# Patient Record
Sex: Female | Born: 1960 | Race: Asian | Hispanic: No | Marital: Married | Smoking: Never smoker
Health system: Southern US, Community
[De-identification: ages and names within clinical notes are randomized; demographics above are authoritative.]

## PROBLEM LIST (undated history)

## (undated) DIAGNOSIS — C73 Malignant neoplasm of thyroid gland: Secondary | ICD-10-CM

## (undated) HISTORY — DX: Malignant neoplasm of thyroid gland: C73

## (undated) HISTORY — PX: COLONOSCOPY: SHX174

---

## 2010-10-26 ENCOUNTER — Ambulatory Visit: Payer: Self-pay | Admitting: Family Medicine

## 2011-06-14 ENCOUNTER — Other Ambulatory Visit: Payer: Self-pay | Admitting: Internal Medicine

## 2011-06-14 DIAGNOSIS — E049 Nontoxic goiter, unspecified: Secondary | ICD-10-CM

## 2011-06-23 ENCOUNTER — Other Ambulatory Visit: Payer: Self-pay

## 2011-11-07 ENCOUNTER — Other Ambulatory Visit: Payer: Self-pay

## 2013-10-25 ENCOUNTER — Other Ambulatory Visit: Payer: Self-pay | Admitting: Internal Medicine

## 2013-10-25 DIAGNOSIS — E042 Nontoxic multinodular goiter: Secondary | ICD-10-CM

## 2013-10-29 ENCOUNTER — Ambulatory Visit
Admission: RE | Admit: 2013-10-29 | Discharge: 2013-10-29 | Disposition: A | Discharge status: Home / Self Care | Payer: No Typology Code available for payment source | Source: Ambulatory Visit | Attending: Internal Medicine | Admitting: Internal Medicine

## 2013-10-29 DIAGNOSIS — E042 Nontoxic multinodular goiter: Secondary | ICD-10-CM

## 2014-10-31 ENCOUNTER — Other Ambulatory Visit: Payer: Self-pay | Admitting: Internal Medicine

## 2014-10-31 DIAGNOSIS — C73 Malignant neoplasm of thyroid gland: Secondary | ICD-10-CM

## 2014-10-31 DIAGNOSIS — E042 Nontoxic multinodular goiter: Secondary | ICD-10-CM

## 2014-11-07 ENCOUNTER — Ambulatory Visit
Admission: RE | Admit: 2014-11-07 | Discharge: 2014-11-07 | Disposition: A | Discharge status: Home / Self Care | Payer: Self-pay | Source: Ambulatory Visit | Attending: Internal Medicine | Admitting: Internal Medicine

## 2014-11-07 DIAGNOSIS — E042 Nontoxic multinodular goiter: Secondary | ICD-10-CM

## 2014-11-07 DIAGNOSIS — C73 Malignant neoplasm of thyroid gland: Secondary | ICD-10-CM

## 2015-02-16 IMAGING — US US SOFT TISSUE HEAD/NECK
1 series · 13 of 25 positions shown · non-contrast
Comparison: 12/19/2012 thyroid ultrasound from [place] provided on
compact disc.

CLINICAL DATA: 52-year-old female with thyroid nodules. Initial
encounter. Patient reports history of papillary thyroid carcinoma.
History of previous biopsy of a left isthmus nodule.

EXAM:
THYROID ULTRASOUND
TECHNIQUE: Ultrasound examination of the thyroid gland and adjacent soft
tissues was performed.

[Series 1: us soft tissue head/neck · 0.05mm/px · 13 of 55 slices shown]
[im 1/55]
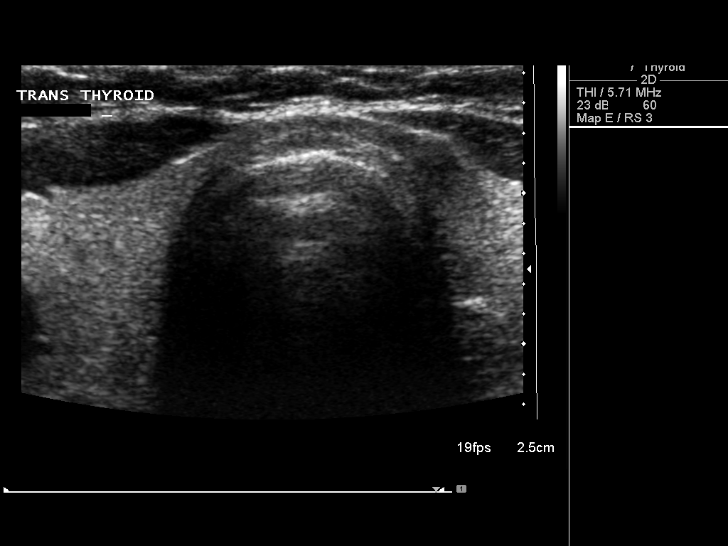
[im 5/55]
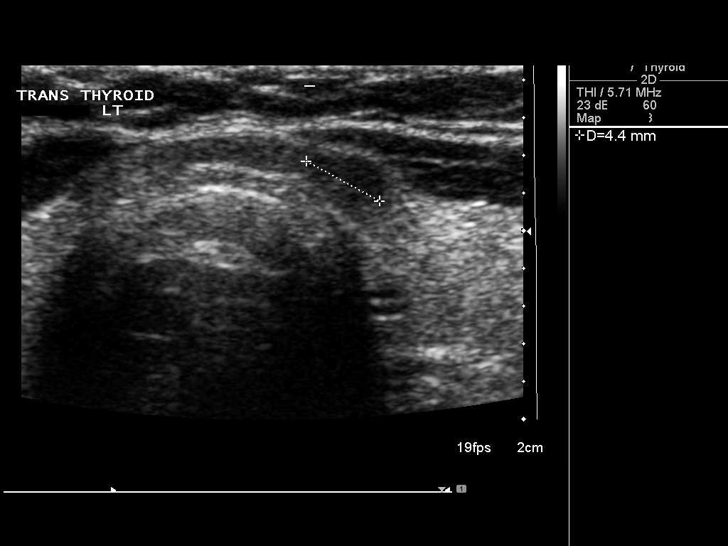
[im 10/55]
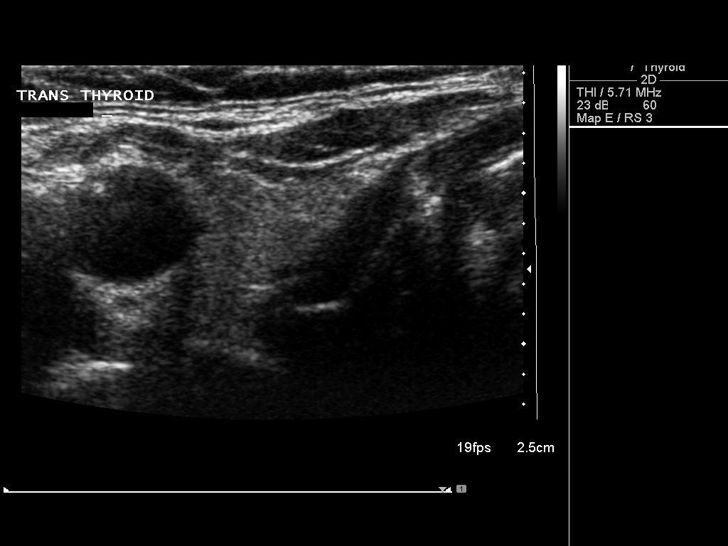
[im 14/55]
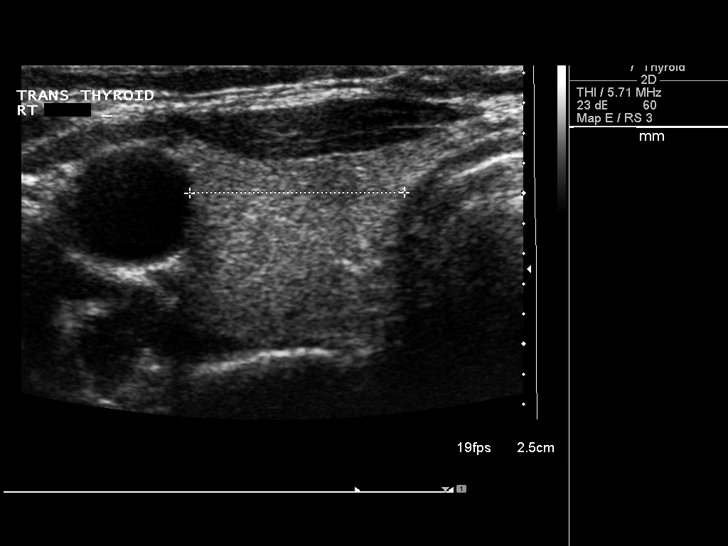
[im 19/55]
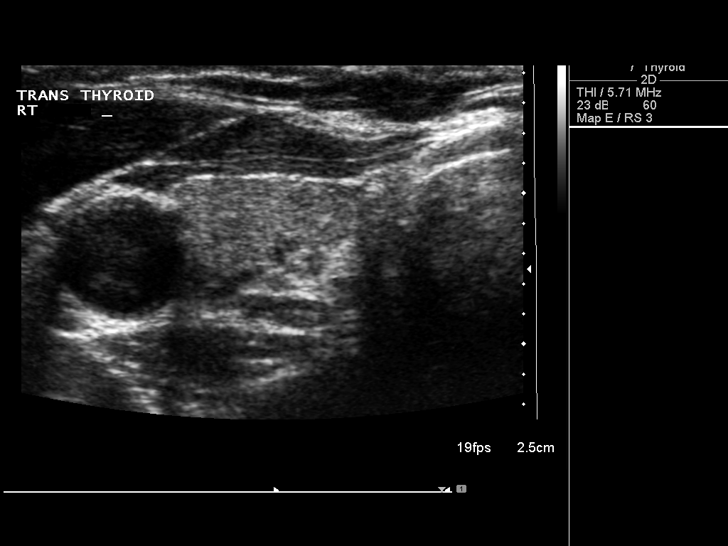
[im 23/55]
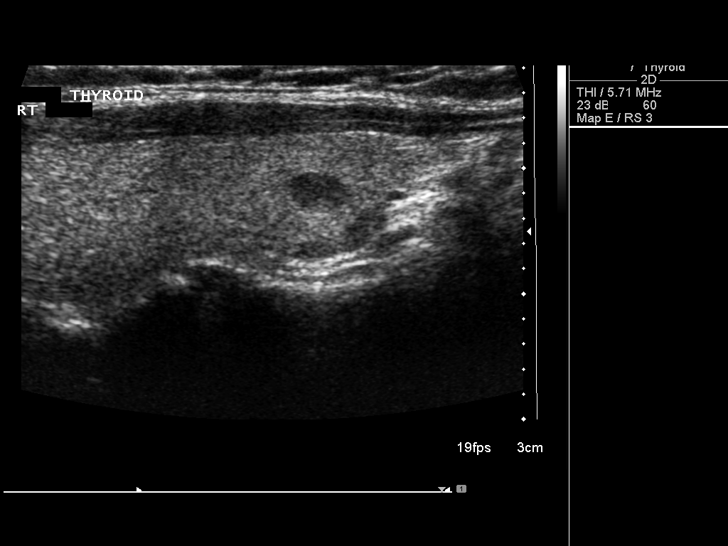
[im 28/55]
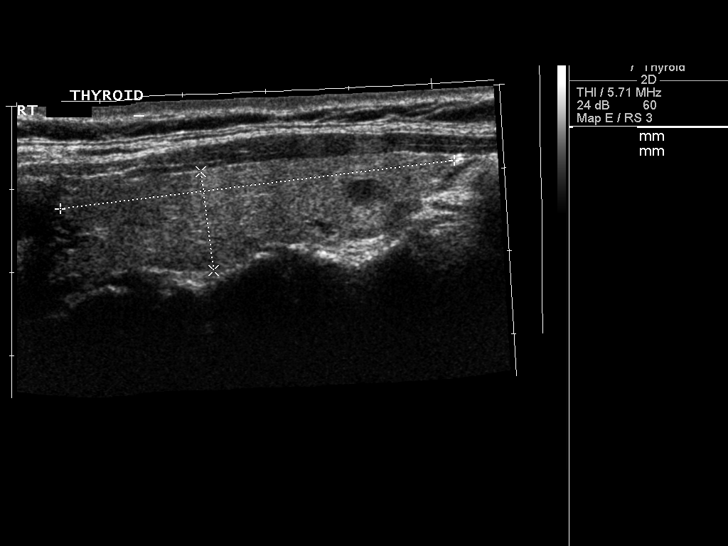
[im 32/55]
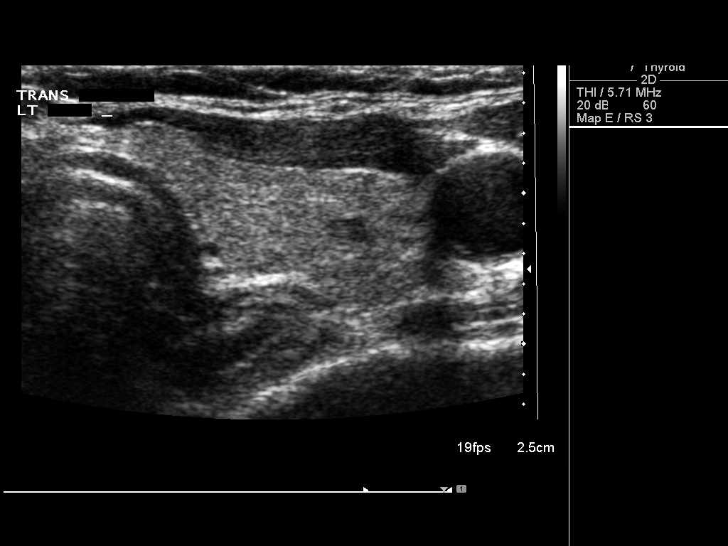
[im 37/55]
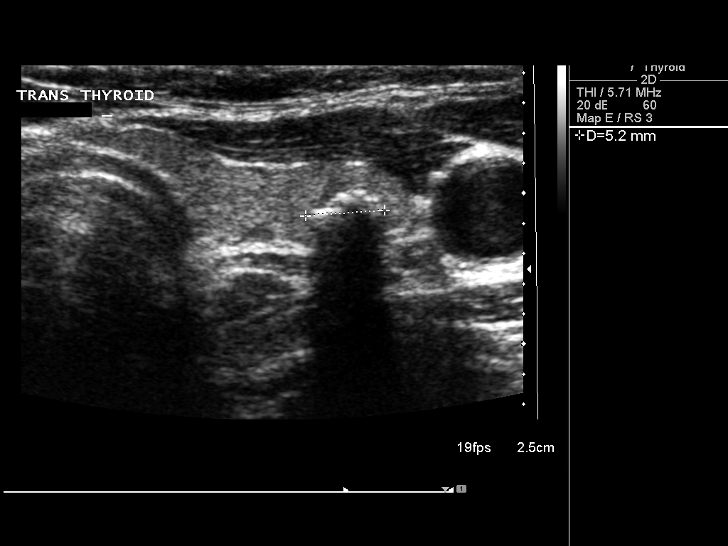
[im 41/55]
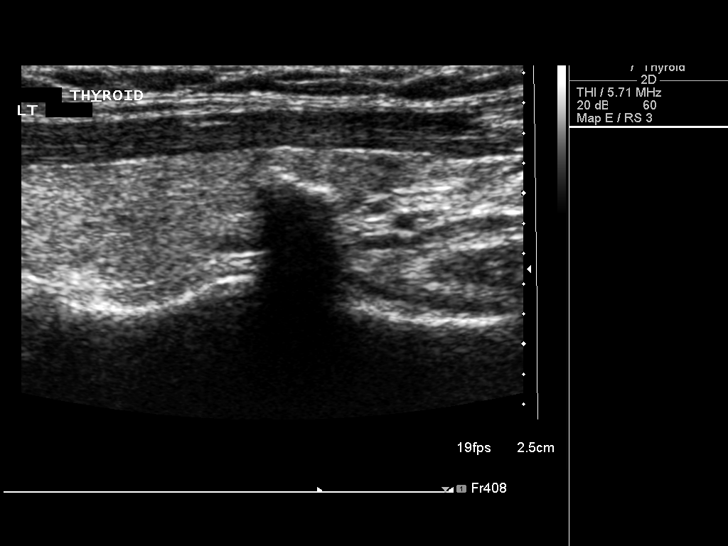
[im 46/55]
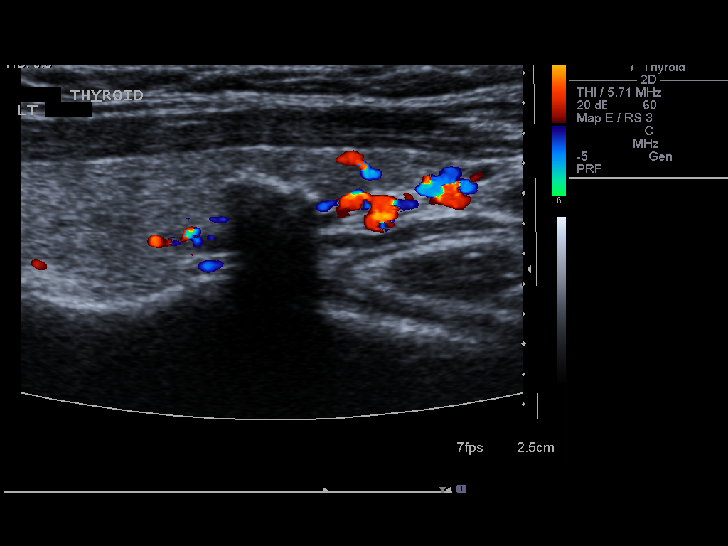
[im 50/55]
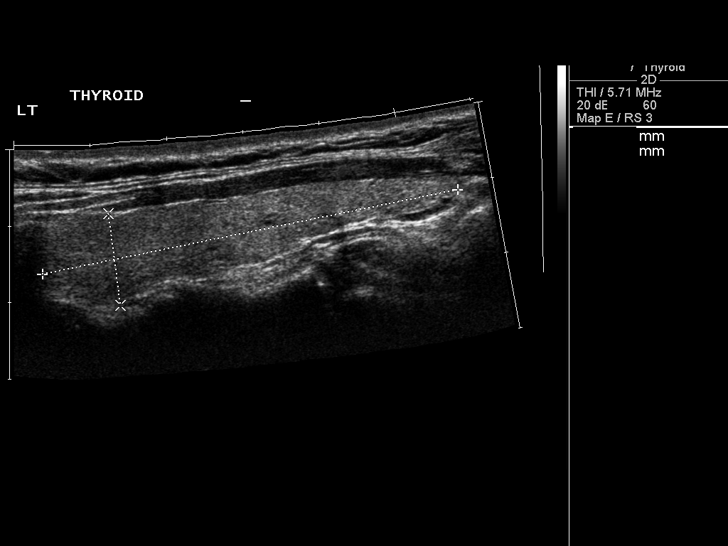
[im 55/55]
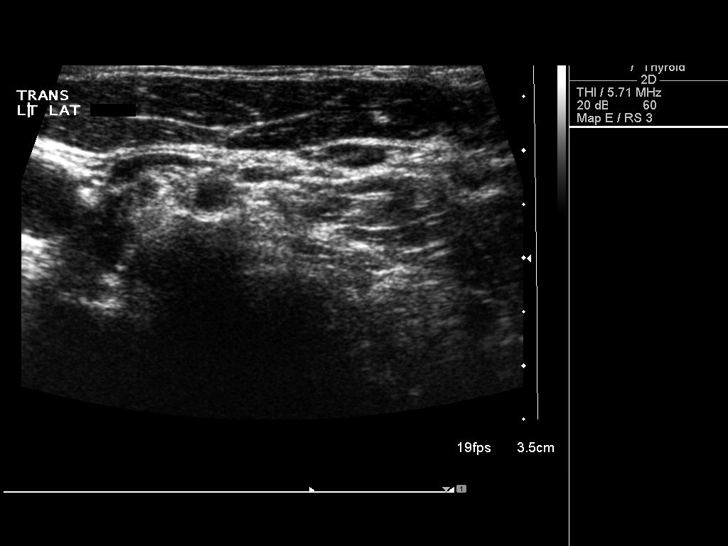

[13 of 25 positions shown; findings below may reference images not displayed]

FINDINGS: Right thyroid lobe

Measurements: 4.8 x 1.2 x 1.4 cm. 2 small hypoechoic nodules, the
larger is 5 x 3 x 5 mm (image 17) and stable. There is a small or 3
mm hypoechoic nodule in the upper pole not previously demonstrated.

Left thyroid lobe

Measurements: 5.5 x 1.2 x 1.5 cm. Chronic coarsely calcified nodule
measuring up to 7 mm diameter (image 38), stable. Tiny 3 mm
hypoechoic nodule in the left upper pole, not previously
demonstrated.

Isthmus

Thickness: 2 mm. Small 3 x 4 x 6 mm hypoechoic nodule at the left
lateral isthmus, stable.

Lymphadenopathy

None visualized.
IMPRESSION: Several subcentimeter bilateral thyroid nodules, the 3 largest
nodules (measuring up to 7 mm) are stable compared to the outside
12/19/2012 exam. There are 2 additional tiny 3 mm nodules which were
not demonstrated on that study.

By report, the 6 mm isthmus nodule was previously biopsied.

Findings do not meet current SRU consensus criteria for biopsy.
Follow-up by clinical exam is recommended. If patient has known risk
factors for thyroid carcinoma, consider follow-up ultrasound in 12
months. If patient is clinically hyperthyroid, consider nuclear
medicine thyroid uptake and scan.

Reference: Management of Thyroid Nodules Detected at US: Society of
Radiologists in Ultrasound Consensus Conference Statement. Radiology

## 2015-06-25 ENCOUNTER — Ambulatory Visit (INDEPENDENT_AMBULATORY_CARE_PROVIDER_SITE_OTHER): Payer: Self-pay | Admitting: Family Medicine

## 2015-06-25 ENCOUNTER — Encounter: Payer: Self-pay | Admitting: Family Medicine

## 2015-06-25 VITALS — BP 110/80 | HR 72 | Ht 63.0 in | Wt 136.0 lb

## 2015-06-25 DIAGNOSIS — R1084 Generalized abdominal pain: Secondary | ICD-10-CM

## 2015-06-25 DIAGNOSIS — R197 Diarrhea, unspecified: Secondary | ICD-10-CM

## 2015-06-25 LAB — POCT URINALYSIS DIPSTICK
BILIRUBIN UA: NEGATIVE
Blood, UA: NEGATIVE
GLUCOSE UA: NEGATIVE
KETONES UA: NEGATIVE
LEUKOCYTES UA: NEGATIVE
NITRITE UA: NEGATIVE
Protein, UA: NEGATIVE
Spec Grav, UA: 1.02
Urobilinogen, UA: NEGATIVE
pH, UA: 7

## 2015-06-25 NOTE — Progress Notes (Signed)
Name: Pamela Riddle   MRN: [id number]    DOB: [birth year]   Date:06/25/2015       Progress Note  Subjective  Chief Complaint  Chief Complaint  Patient presents with  . Establish Care  . Abdominal Pain    having symptoms of loose stools, more frequent, RLQ pain off and on since colonoscopy 2 years ago-  has to go to [place] right after eating regardless of what she eats    Abdominal Pain This is a chronic problem. The current episode started more than 1 year ago. The onset quality is gradual. The problem occurs daily. The problem has been unchanged. The pain is located in the suprapubic region. The pain is moderate. The quality of the pain is colicky. Associated symptoms include hematochezia and hematuria. Pertinent negatives include no constipation, diarrhea, dysuria, fever, frequency, headaches, melena, myalgias, nausea or weight loss. Associated symptoms comments: malaise. She has tried nothing (heat) for the symptoms.    No problem-specific assessment & plan notes found for this encounter.   Past Medical History  Diagnosis Date  . Thyroid cancer [facility])     Past Surgical History  Procedure Laterality Date  . Cesarean section      x 2  . Colonoscopy      in [place]- colonoscopy    No family history on file.  Social History   Social History  . Marital Status: Married    Spouse Name: N/A  . Number of Children: N/A  . Years of Education: N/A   Occupational History  . Not on file.   Social History Main Topics  . Smoking status: Never Smoker   . Smokeless tobacco: Not on file  . Alcohol Use: No  . Drug Use: No  . Sexual Activity: Not on file   Other Topics Concern  . Not on file   Social History Narrative  . No narrative on file    No Known Allergies   Review of Systems  Constitutional: Negative for fever, chills, weight loss and malaise/fatigue.  HENT: Negative for ear discharge, ear pain and sore throat.   Eyes: Negative for blurred vision.  Respiratory:  Negative for cough, sputum production, shortness of breath and wheezing.   Cardiovascular: Negative for chest pain, palpitations and leg swelling.  Gastrointestinal: Positive for abdominal pain, blood in stool and hematochezia. Negative for heartburn, nausea, diarrhea, constipation and melena.  Genitourinary: Positive for hematuria. Negative for dysuria, urgency and frequency.  Musculoskeletal: Negative for myalgias, back pain, joint pain and neck pain.  Skin: Negative for rash.  Neurological: Negative for dizziness, tingling, sensory change, focal weakness and headaches.  Endo/Heme/Allergies: Negative for environmental allergies and polydipsia. Does not bruise/bleed easily.  Psychiatric/Behavioral: Negative for depression and suicidal ideas. The patient is not nervous/anxious and does not have insomnia.      Objective  Filed Vitals:   06/25/15 1451  BP: 110/80  Pulse: 72  Height: 5\' 3"  (1.6 m)  Weight: 136 lb (61.689 kg)    Physical Exam  Constitutional: She is well-developed, well-nourished, and in no distress. No distress.  HENT:  Head: Normocephalic and atraumatic.  Right Ear: External ear normal.  Left Ear: External ear normal.  Nose: Nose normal.  Mouth/Throat: Oropharynx is clear and moist.  Eyes: Conjunctivae and EOM are normal. Pupils are equal, round, and reactive to light. Right eye exhibits no discharge. Left eye exhibits no discharge.  Neck: Normal range of motion. Neck supple. No JVD present. No thyromegaly present.  Cardiovascular: Normal rate,  regular rhythm, normal heart sounds and intact distal pulses.  Exam reveals no gallop and no friction rub.   No murmur heard. Pulmonary/Chest: Effort normal and breath sounds normal.  Abdominal: Soft. Bowel sounds are normal. She exhibits no mass. There is no tenderness. There is no guarding.  Musculoskeletal: Normal range of motion. She exhibits no edema.  Lymphadenopathy:    She has no cervical adenopathy.  Neurological:  She is alert. She has normal reflexes.  Skin: Skin is warm and dry. She is not diaphoretic.  Psychiatric: Mood and affect normal.      Assessment & Plan  Problem List Items Addressed This Visit    None    Visit Diagnoses    Generalized abdominal pain    -  Primary    hemoccult x 3/     Relevant Orders    POCT Urinalysis Dipstick (Completed)    Diarrhea, unspecified type             Dr. Mac[facility]  06/25/2015

## 2015-07-07 ENCOUNTER — Other Ambulatory Visit (INDEPENDENT_AMBULATORY_CARE_PROVIDER_SITE_OTHER): Payer: Self-pay

## 2015-07-07 DIAGNOSIS — R1084 Generalized abdominal pain: Secondary | ICD-10-CM

## 2015-07-07 LAB — HEMOCCULT GUIAC POC 1CARD (OFFICE)
FECAL OCCULT BLD: NEGATIVE
FECAL OCCULT BLD: NEGATIVE
FECAL OCCULT BLD: NEGATIVE

## 2015-07-21 ENCOUNTER — Ambulatory Visit (INDEPENDENT_AMBULATORY_CARE_PROVIDER_SITE_OTHER): Payer: Self-pay | Admitting: Family Medicine

## 2015-07-21 ENCOUNTER — Encounter: Payer: Self-pay | Admitting: Family Medicine

## 2015-07-21 VITALS — BP 120/80 | HR 76 | Ht 63.0 in | Wt 136.0 lb

## 2015-07-21 DIAGNOSIS — K921 Melena: Secondary | ICD-10-CM

## 2015-07-21 NOTE — Progress Notes (Signed)
Name: Pamela Riddle   MRN: [id number]    DOB: 1961/05/14   Date:07/21/2015       Progress Note  Subjective  Chief Complaint  Chief Complaint  Patient presents with  . Annual Exam    no pap and not time for mammo- had one at the beginning of this year    HPI Comments: Patient for physical exam with no subjective/objective concerns.   No problem-specific assessment & plan notes found for this encounter.   Past Medical History  Diagnosis Date  . Thyroid cancer [facility])     Past Surgical History  Procedure Laterality Date  . Cesarean section      x 2  . Colonoscopy      in [place]- colonoscopy    No family history on file.  Social History   Social History  . Marital Status: Married    Spouse Name: N/A  . Number of Children: N/A  . Years of Education: N/A   Occupational History  . Not on file.   Social History Main Topics  . Smoking status: Never Smoker   . Smokeless tobacco: Not on file  . Alcohol Use: No  . Drug Use: No  . Sexual Activity: Not on file   Other Topics Concern  . Not on file   Social History Narrative    No Known Allergies   Review of Systems  Constitutional: Negative for fever, chills, weight loss and malaise/fatigue.  HENT: Negative for ear discharge, ear pain and sore throat.   Eyes: Negative for blurred vision.  Respiratory: Negative for cough, sputum production, shortness of breath and wheezing.   Cardiovascular: Negative for chest pain, palpitations and leg swelling.  Gastrointestinal: Negative for heartburn, nausea, abdominal pain, diarrhea, constipation, blood in stool and melena.  Genitourinary: Negative for dysuria, urgency, frequency and hematuria.  Musculoskeletal: Negative for myalgias, back pain, joint pain and neck pain.  Skin: Negative for rash.  Neurological: Negative for dizziness, tingling, sensory change, focal weakness and headaches.  Endo/Heme/Allergies: Negative for environmental allergies and polydipsia. Does not  bruise/bleed easily.  Psychiatric/Behavioral: Negative for depression and suicidal ideas. The patient is not nervous/anxious and does not have insomnia.      Objective  Filed Vitals:   07/21/15 1432  BP: 120/80  Pulse: 76  Height: 5\' 3"  (1.6 m)  Weight: 136 lb (61.689 kg)    Physical Exam  Constitutional: She is well-developed, well-nourished, and in no distress. No distress.  HENT:  Head: Normocephalic and atraumatic.  Right Ear: External ear normal.  Left Ear: External ear normal.  Nose: Nose normal.  Mouth/Throat: Oropharynx is clear and moist.  Eyes: Conjunctivae and EOM are normal. Pupils are equal, round, and reactive to light. Right eye exhibits no discharge. Left eye exhibits no discharge.  Neck: Normal range of motion. Neck supple. No JVD present. No thyromegaly present.  Cardiovascular: Normal rate, regular rhythm, normal heart sounds and intact distal pulses.  Exam reveals no gallop and no friction rub.   No murmur heard. Pulmonary/Chest: Effort normal and breath sounds normal.  Abdominal: Soft. Bowel sounds are normal. She exhibits no mass. There is no tenderness. There is no guarding.  Musculoskeletal: Normal range of motion. She exhibits no edema.  Lymphadenopathy:    She has no cervical adenopathy.  Neurological: She is alert. She has normal reflexes.  Skin: Skin is warm and dry. She is not diaphoretic.  Psychiatric: Mood and affect normal.  Nursing note and vitals reviewed.     Assessment &  Plan  Problem List Items Addressed This Visit    None    Visit Diagnoses    Hematochezia    -  Primary    daughter to find out results of [place] evaluation.    Relevant Orders    CBC         Dr. Otilio Miu [facility]  07/21/2015

## 2015-07-21 NOTE — Patient Instructions (Signed)

## 2015-07-22 LAB — CBC
HEMATOCRIT: 37.4 % (ref 34.0–46.6)
HEMOGLOBIN: 12.8 g/dL (ref 11.1–15.9)
MCH: 29.3 pg (ref 26.6–33.0)
MCHC: 34.2 g/dL (ref 31.5–35.7)
MCV: 86 fL (ref 79–97)
Platelets: 333 10*3/uL (ref 150–379)
RBC: 4.37 x10E6/uL (ref 3.77–5.28)
RDW: 13.6 % (ref 12.3–15.4)
WBC: 7.1 10*3/uL (ref 3.4–10.8)

## 2015-11-19 ENCOUNTER — Other Ambulatory Visit: Payer: Self-pay | Admitting: Internal Medicine

## 2015-11-19 DIAGNOSIS — C73 Malignant neoplasm of thyroid gland: Secondary | ICD-10-CM

## 2015-11-24 ENCOUNTER — Ambulatory Visit
Admission: RE | Admit: 2015-11-24 | Discharge: 2015-11-24 | Disposition: A | Discharge status: Home / Self Care | Payer: No Typology Code available for payment source | Source: Ambulatory Visit | Attending: Internal Medicine | Admitting: Internal Medicine

## 2015-11-24 DIAGNOSIS — C73 Malignant neoplasm of thyroid gland: Secondary | ICD-10-CM

## 2017-03-13 IMAGING — US US SOFT TISSUE HEAD/NECK
1 series · 13 of 25 positions shown · non-contrast
Comparison: 11/07/2014, 10/29/2013, 10/26/2010.

CLINICAL DATA: 55-year-old female with a history of papillary
carcinoma thyroid

EXAM:
THYROID ULTRASOUND
TECHNIQUE: Ultrasound examination of the thyroid gland and adjacent soft
tissues was performed.

[Series 1: us soft tissue head/neck · 0.07mm/px · 13 of 52 slices shown]
[im 1/52]
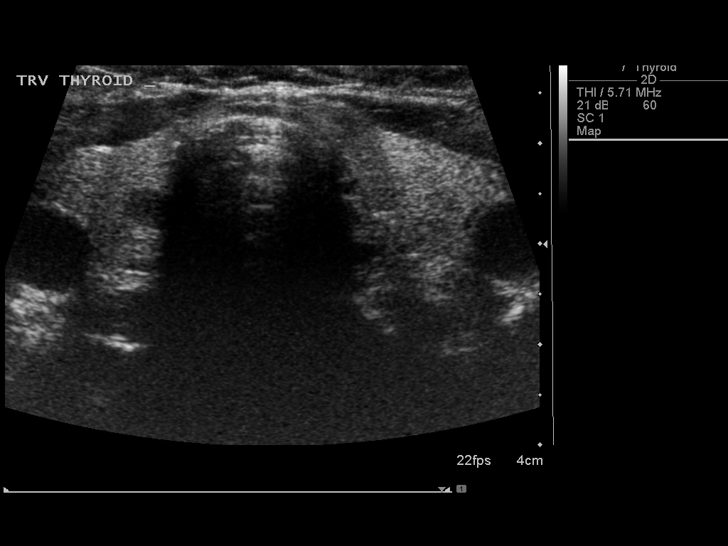
[im 5/52]
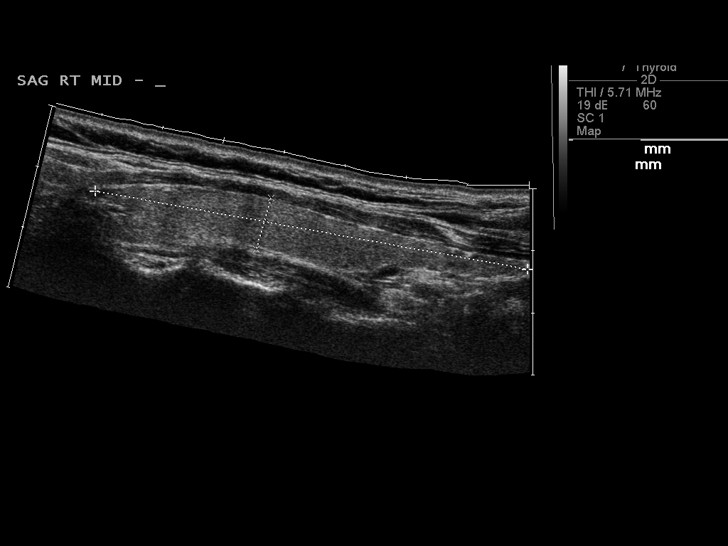
[im 9/52]
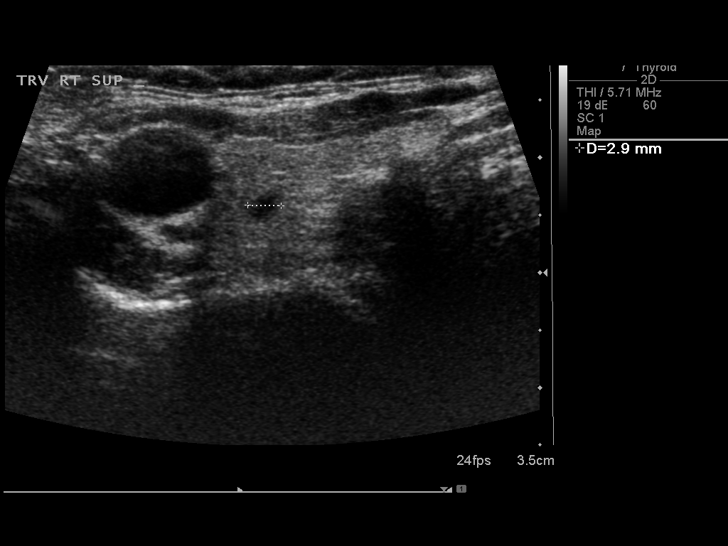
[im 13/52]
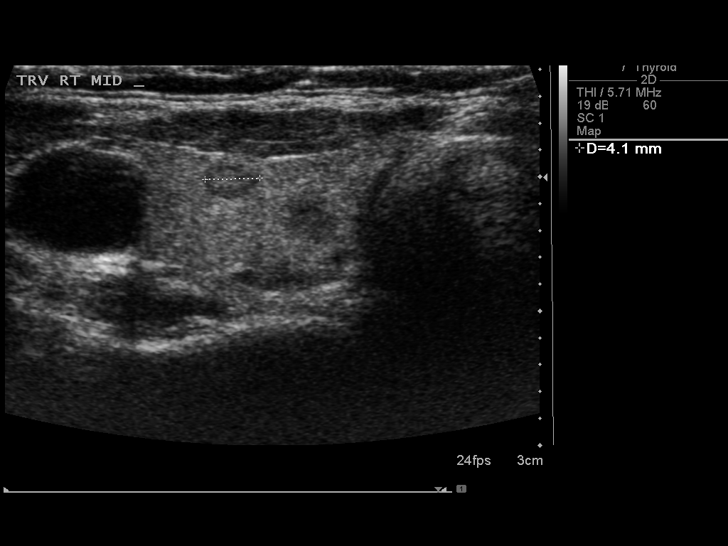
[im 18/52]
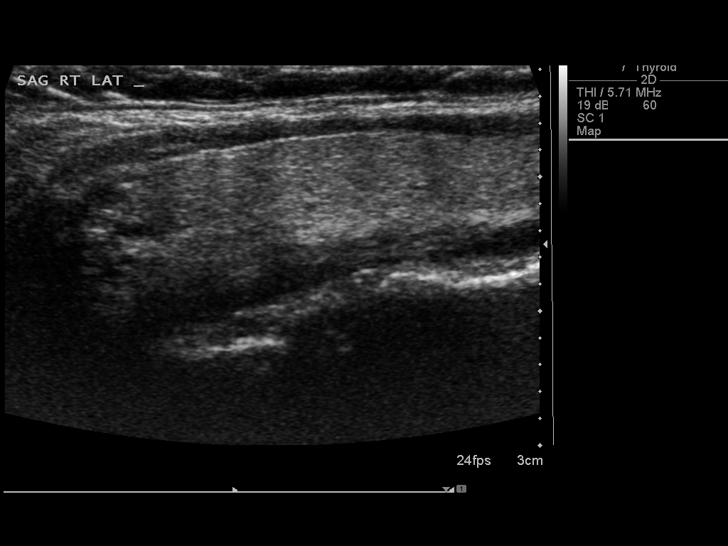
[im 22/52]
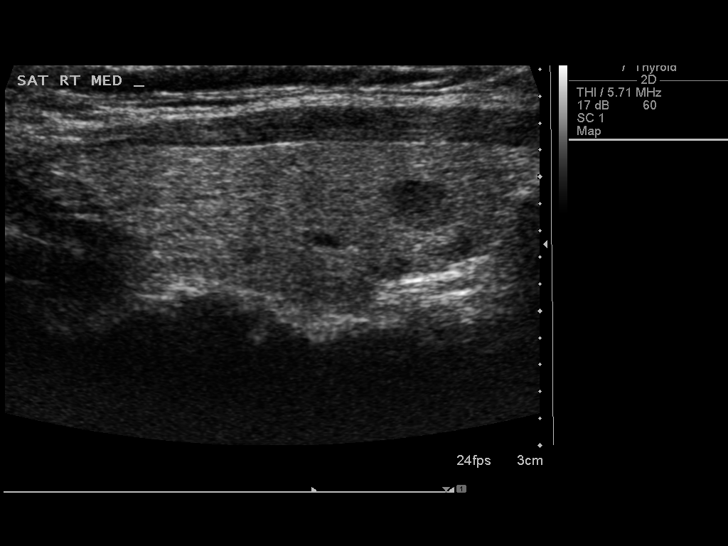
[im 26/52]
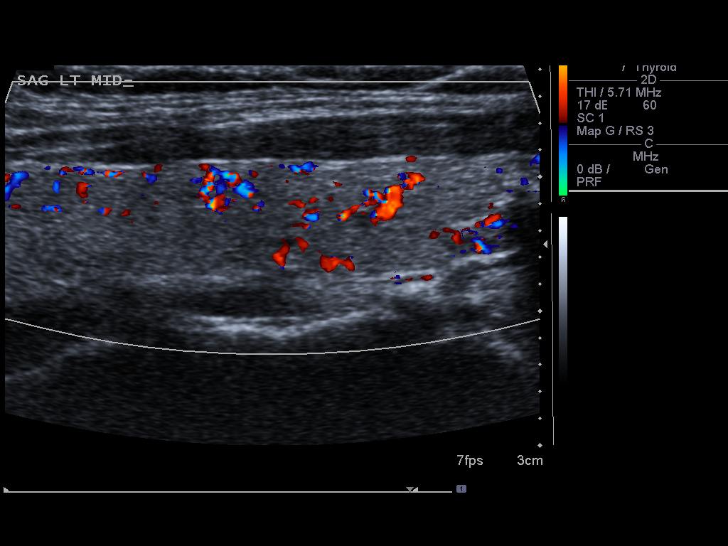
[im 30/52]
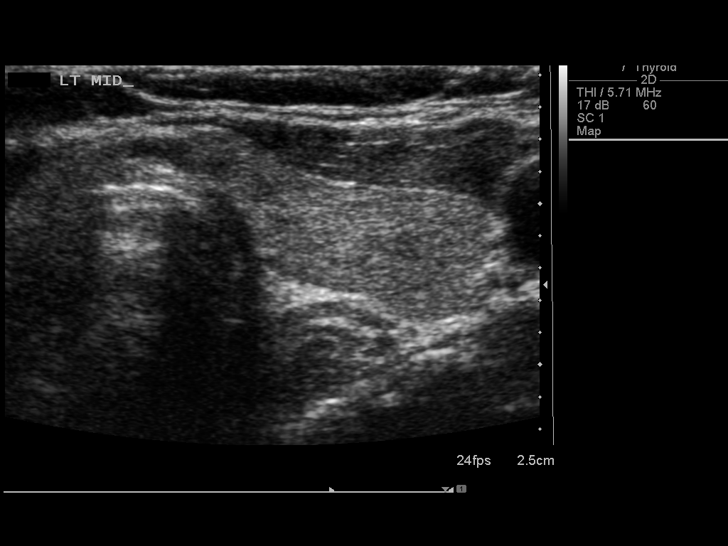
[im 35/52]
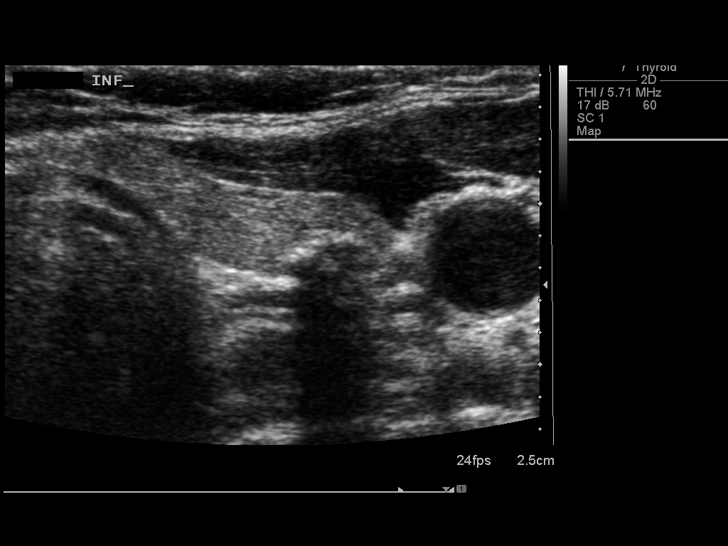
[im 39/52]
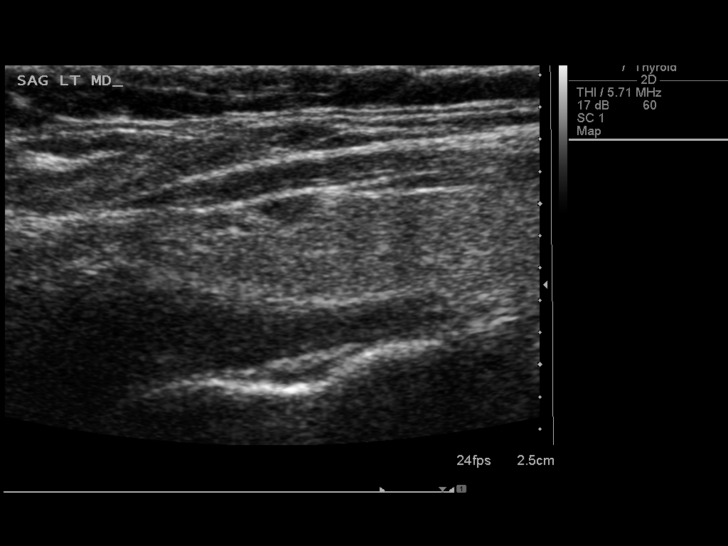
[im 43/52]
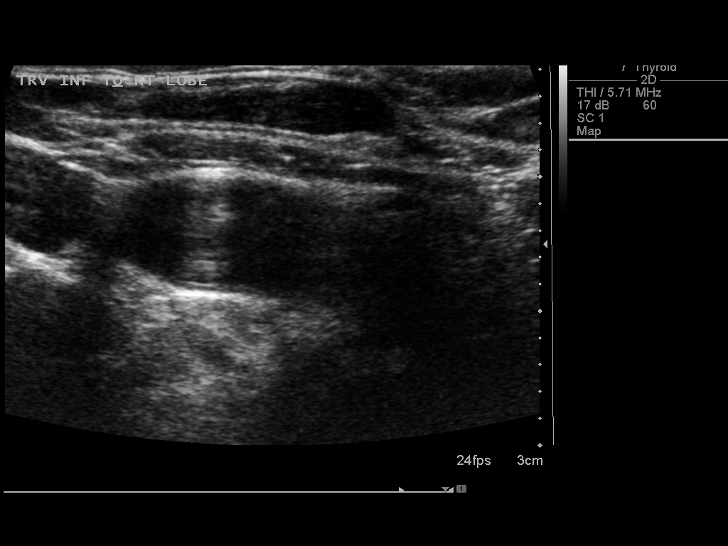
[im 47/52]
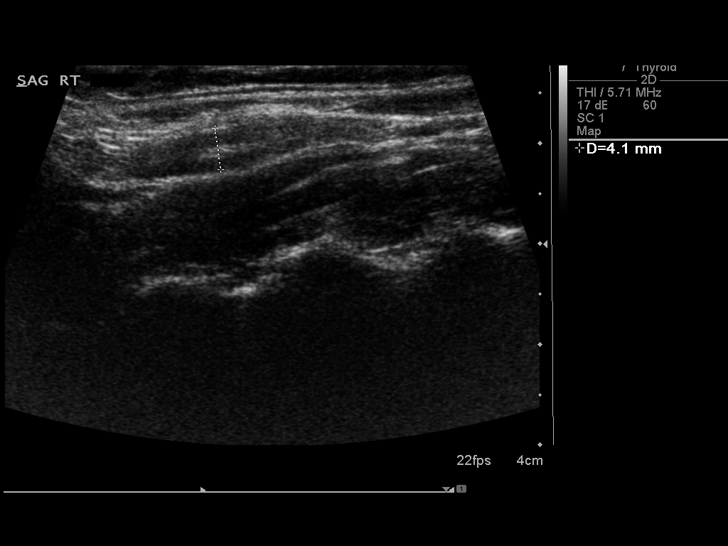
[im 52/52]
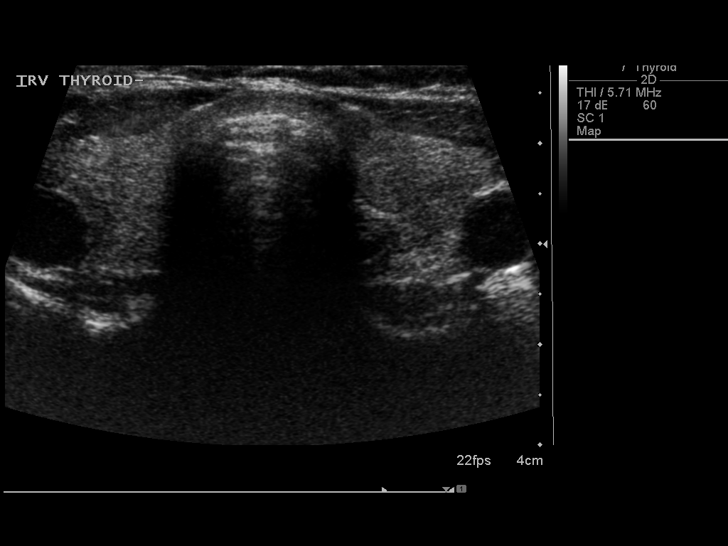

[13 of 25 positions shown; findings below may reference images not displayed]

FINDINGS: Right thyroid lobe

Measurements: 7.1 cm x 9 mm x 1.5 cm. Relatively homogeneous
appearance of right thyroid tissues.

Several hypoechoic nodules of the right thyroid, all measuring less
than 4 mm.

Left thyroid lobe

Measurements: 6.1 cm x 1.0 cm x 1.6 cm. Relatively homogeneous
appearance of left thyroid tissue.

Coarsened calcification at the inferior left thyroid.

Hypoechoic nodules of the left thyroid, measuring less than 4 mm.

Isthmus

Thickness: 2 mm.  No nodules visualized.

Lymphadenopathy

None visualized.
IMPRESSION: Multinodular thyroid, with no lesion meeting criteria for biopsy.

Findings do not meet current SRU consensus criteria for biopsy.
Follow-up by clinical exam is recommended. If patient has known risk
factors for thyroid carcinoma, consider follow-up ultrasound in 12
months. If patient is clinically hyperthyroid, consider nuclear
medicine thyroid uptake and scan.Reference: Management of Thyroid
Nodules Detected at US: Society of Radiologists in Ultrasound

## 2017-03-14 ENCOUNTER — Ambulatory Visit
Admission: EM | Admit: 2017-03-14 | Discharge: 2017-03-14 | Discharge status: Absent without leave | Payer: No Typology Code available for payment source

## 2017-03-14 ENCOUNTER — Encounter: Payer: Self-pay | Admitting: Family Medicine

## 2017-03-14 ENCOUNTER — Ambulatory Visit (INDEPENDENT_AMBULATORY_CARE_PROVIDER_SITE_OTHER): Payer: Self-pay | Admitting: Family Medicine

## 2017-03-14 VITALS — BP 120/82 | HR 76 | Temp 98.0°F | Ht 63.0 in | Wt 133.0 lb

## 2017-03-14 DIAGNOSIS — R102 Pelvic and perineal pain: Secondary | ICD-10-CM

## 2017-03-14 DIAGNOSIS — N342 Other urethritis: Secondary | ICD-10-CM

## 2017-03-14 LAB — POCT URINALYSIS DIPSTICK
BILIRUBIN UA: NEGATIVE
Glucose, UA: NEGATIVE
KETONES UA: NEGATIVE
Leukocytes, UA: NEGATIVE
Nitrite, UA: NEGATIVE
PH UA: 5 (ref 5.0–8.0)
Protein, UA: NEGATIVE
RBC UA: NEGATIVE
Spec Grav, UA: <=1.005 — AB (ref 1.010–1.025)
Urobilinogen, UA: 0.2 E.U./dL

## 2017-03-14 MED ORDER — SULFAMETHOXAZOLE-TRIMETHOPRIM 800-160 MG PO TABS: 1.0000 | ORAL_TABLET | Freq: Two times a day (BID) | ORAL | 0 refills | Status: DC

## 2017-03-14 MED ORDER — METRONIDAZOLE 500 MG PO TABS: 500.0000 mg | ORAL_TABLET | Freq: Two times a day (BID) | ORAL | 0 refills | Status: DC

## 2017-03-14 NOTE — Patient Instructions (Signed)

## 2017-03-14 NOTE — Progress Notes (Signed)
Name: Pamela Riddle   MRN: [id number]    DOB: 04/22/1961   Date:03/14/2017       Progress Note  Subjective  Chief Complaint  Chief Complaint  Patient presents with  . Urinary Tract Infection    hard time urinating/ pain in lower abdomen and when urinating x 1 week. no blood has been seen when wiping. Advil and cranberry- tried with no success    Urinary Tract Infection   This is a new problem. The current episode started in the past 7 days. The problem occurs every urination. The problem has been waxing and waning. The quality of the pain is described as burning. The pain is at a severity of 4/10. The pain is moderate. There has been no fever. Associated symptoms include a discharge, frequency and urgency. Pertinent negatives include no chills, flank pain, hematuria, hesitancy, nausea, sweats or vomiting. She has tried nothing for the symptoms. The treatment provided mild relief. There is no history of recurrent UTIs.    No problem-specific Assessment & Plan notes found for this encounter.   Past Medical History:  Diagnosis Date  . Thyroid cancer [facility])     Past Surgical History:  Procedure Laterality Date  . CESAREAN SECTION     x 2  . COLONOSCOPY     in [place]- colonoscopy    No family history on file.  Social History   Social History  . Marital status: Married    Spouse name: N/A  . Number of children: N/A  . Years of education: N/A   Occupational History  . Not on file.   Social History Main Topics  . Smoking status: Never Smoker  . Smokeless tobacco: Never Used  . Alcohol use No  . Drug use: No  . Sexual activity: Not on file   Other Topics Concern  . Not on file   Social History Narrative  . No narrative on file    No Known Allergies  No outpatient prescriptions prior to visit.   No facility-administered medications prior to visit.     Review of Systems  Constitutional: Negative for chills, fever, malaise/fatigue and weight loss.  HENT: Negative  for ear discharge, ear pain and sore throat.   Eyes: Negative for blurred vision.  Respiratory: Negative for cough, sputum production, shortness of breath and wheezing.   Cardiovascular: Negative for chest pain, palpitations and leg swelling.  Gastrointestinal: Negative for abdominal pain, blood in stool, constipation, diarrhea, heartburn, melena, nausea and vomiting.  Genitourinary: Positive for frequency and urgency. Negative for dysuria, flank pain, hematuria and hesitancy.  Musculoskeletal: Negative for back pain, joint pain, myalgias and neck pain.  Skin: Negative for rash.  Neurological: Negative for dizziness, tingling, sensory change, focal weakness and headaches.  Endo/Heme/Allergies: Negative for environmental allergies and polydipsia. Does not bruise/bleed easily.  Psychiatric/Behavioral: Negative for depression and suicidal ideas. The patient is not nervous/anxious and does not have insomnia.      Objective  Vitals:   03/14/17 1450 03/14/17 1501  BP: 120/82   Pulse: 76   Temp:  98 F (36.7 C)  Weight: 133 lb (60.3 kg)   Height: 5\' 3"  (1.6 m)     Physical Exam  Constitutional: She is well-developed, well-nourished, and in no distress. No distress.  HENT:  Head: Normocephalic and atraumatic.  Right Ear: Tympanic membrane, external ear and ear canal normal.  Left Ear: Tympanic membrane, external ear and ear canal normal.  Nose: Nose normal.  Mouth/Throat: Oropharynx is clear and moist.  Eyes: Conjunctivae and EOM are normal. Pupils are equal, round, and reactive to light. Right eye exhibits no discharge. Left eye exhibits no discharge.  Neck: Normal range of motion. Neck supple. No JVD present. No thyromegaly present.  Cardiovascular: Normal rate, regular rhythm, normal heart sounds and intact distal pulses.  Exam reveals no gallop and no friction rub.   No murmur heard. Pulmonary/Chest: Effort normal and breath sounds normal. She has no wheezes. She has no rales.   Abdominal: Soft. Bowel sounds are normal. She exhibits no mass. There is tenderness in the suprapubic area. There is rebound. There is no rigidity, no guarding and no CVA tenderness.  Musculoskeletal: Normal range of motion. She exhibits no edema.  Lymphadenopathy:    She has no cervical adenopathy.  Neurological: She is alert. She has normal reflexes.  Skin: Skin is warm and dry. She is not diaphoretic.  Psychiatric: Mood and affect normal.  Nursing note and vitals reviewed.     Assessment & Plan  Problem List Items Addressed This Visit    None    Visit Diagnoses    Urethritis    -  Primary   Relevant Medications   sulfamethoxazole-trimethoprim (BACTRIM DS,SEPTRA DS) 800-160 MG tablet   metroNIDAZOLE (FLAGYL) 500 MG tablet   Other Relevant Orders   CBC with Differential/Platelet   POCT urinalysis dipstick (Completed)   Suprapubic pain       rtc or er if cont or worsens   Relevant Medications   sulfamethoxazole-trimethoprim (BACTRIM DS,SEPTRA DS) 800-160 MG tablet   metroNIDAZOLE (FLAGYL) 500 MG tablet   Other Relevant Orders   CBC with Differential/Platelet   POCT urinalysis dipstick (Completed)      Meds ordered this encounter  Medications  . sulfamethoxazole-trimethoprim (BACTRIM DS,SEPTRA DS) 800-160 MG tablet    Sig: Take 1 tablet by mouth 2 (two) times daily.    Dispense:  20 tablet    Refill:  0  . metroNIDAZOLE (FLAGYL) 500 MG tablet    Sig: Take 1 tablet (500 mg total) by mouth 2 (two) times daily.    Dispense:  20 tablet    Refill:  0      Dr. Ziare [facility] [place] [facility]  03/14/17

## 2017-03-15 LAB — CBC WITH DIFFERENTIAL/PLATELET
BASOS: 1 %
Basophils Absolute: 0.1 10*3/uL (ref 0.0–0.2)
EOS (ABSOLUTE): 0.1 10*3/uL (ref 0.0–0.4)
Eos: 2 %
Hematocrit: 39.6 % (ref 34.0–46.6)
Hemoglobin: 13.9 g/dL (ref 11.1–15.9)
IMMATURE GRANULOCYTES: 0 %
Immature Grans (Abs): 0 10*3/uL (ref 0.0–0.1)
LYMPHS ABS: 2.9 10*3/uL (ref 0.7–3.1)
Lymphs: 39 %
MCH: 30 pg (ref 26.6–33.0)
MCHC: 35.1 g/dL (ref 31.5–35.7)
MCV: 86 fL (ref 79–97)
MONOS ABS: 0.3 10*3/uL (ref 0.1–0.9)
Monocytes: 4 %
NEUTROS PCT: 54 %
Neutrophils Absolute: 4.1 10*3/uL (ref 1.4–7.0)
PLATELETS: 378 10*3/uL (ref 150–379)
RBC: 4.63 x10E6/uL (ref 3.77–5.28)
RDW: 14.1 % (ref 12.3–15.4)
WBC: 7.4 10*3/uL (ref 3.4–10.8)

## 2017-03-17 ENCOUNTER — Other Ambulatory Visit: Payer: Self-pay | Admitting: Internal Medicine

## 2017-03-17 DIAGNOSIS — E042 Nontoxic multinodular goiter: Secondary | ICD-10-CM

## 2017-03-28 ENCOUNTER — Ambulatory Visit
Admission: RE | Admit: 2017-03-28 | Discharge: 2017-03-28 | Disposition: A | Discharge status: Home / Self Care | Payer: No Typology Code available for payment source | Source: Ambulatory Visit | Attending: Internal Medicine | Admitting: Internal Medicine

## 2017-03-28 DIAGNOSIS — E042 Nontoxic multinodular goiter: Secondary | ICD-10-CM

## 2017-07-17 ENCOUNTER — Encounter: Payer: Self-pay | Admitting: Family Medicine

## 2017-07-17 ENCOUNTER — Ambulatory Visit (INDEPENDENT_AMBULATORY_CARE_PROVIDER_SITE_OTHER): Payer: Self-pay | Admitting: Family Medicine

## 2017-07-17 ENCOUNTER — Other Ambulatory Visit
Admission: RE | Admit: 2017-07-17 | Discharge: 2017-07-17 | Disposition: A | Discharge status: Home / Self Care | Payer: No Typology Code available for payment source | Source: Ambulatory Visit | Attending: Family Medicine | Admitting: Family Medicine

## 2017-07-17 VITALS — BP 120/80 | HR 80 | Temp 100.0°F | Ht 63.0 in | Wt 140.0 lb

## 2017-07-17 DIAGNOSIS — N3001 Acute cystitis with hematuria: Secondary | ICD-10-CM

## 2017-07-17 DIAGNOSIS — Z85850 Personal history of malignant neoplasm of thyroid: Secondary | ICD-10-CM | POA: Insufficient documentation

## 2017-07-17 DIAGNOSIS — N898 Other specified noninflammatory disorders of vagina: Secondary | ICD-10-CM | POA: Insufficient documentation

## 2017-07-17 DIAGNOSIS — N12 Tubulo-interstitial nephritis, not specified as acute or chronic: Secondary | ICD-10-CM | POA: Insufficient documentation

## 2017-07-17 LAB — POCT URINALYSIS DIPSTICK
Bilirubin, UA: NEGATIVE
GLUCOSE UA: NEGATIVE
Ketones, UA: NEGATIVE
Nitrite, UA: NEGATIVE
SPEC GRAV UA: 1.015 (ref 1.010–1.025)
UROBILINOGEN UA: 0.2 U/dL
pH, UA: 5 (ref 5.0–8.0)

## 2017-07-17 LAB — CBC WITH DIFFERENTIAL/PLATELET
Basophils Absolute: 0 10*3/uL (ref 0–0.1)
Basophils Relative: 1 %
EOS PCT: 0 %
Eosinophils Absolute: 0 10*3/uL (ref 0–0.7)
HCT: 34.8 % — ABNORMAL LOW (ref 35.0–47.0)
Hemoglobin: 12.1 g/dL (ref 12.0–16.0)
LYMPHS ABS: 1 10*3/uL (ref 1.0–3.6)
LYMPHS PCT: 12 %
MCH: 29.9 pg (ref 26.0–34.0)
MCHC: 34.8 g/dL (ref 32.0–36.0)
MCV: 85.9 fL (ref 80.0–100.0)
MONO ABS: 0.6 10*3/uL (ref 0.2–0.9)
Monocytes Relative: 7 %
Neutro Abs: 6.8 10*3/uL — ABNORMAL HIGH (ref 1.4–6.5)
Neutrophils Relative %: 80 %
PLATELETS: 298 10*3/uL (ref 150–440)
RBC: 4.05 MIL/uL (ref 3.80–5.20)
RDW: 12.8 % (ref 11.5–14.5)
WBC: 8.4 10*3/uL (ref 3.6–11.0)

## 2017-07-17 MED ORDER — CIPROFLOXACIN HCL 500 MG PO TABS: 500.0000 mg | ORAL_TABLET | Freq: Two times a day (BID) | ORAL | 0 refills | Status: AC

## 2017-07-17 MED ORDER — FLUCONAZOLE 150 MG PO TABS: 150.0000 mg | ORAL_TABLET | Freq: Once | ORAL | 0 refills | Status: AC

## 2017-07-17 NOTE — Progress Notes (Signed)
Name: Pamela Riddle   MRN: [id number]    DOB: 1961/07/16   Date:07/17/2017       Progress Note  Subjective  Chief Complaint  Chief Complaint  Patient presents with  . Urinary Tract Infection    Urinary Tract Infection   This is a new problem. The current episode started 1 to 4 weeks ago. The problem has been gradually worsening. The pain is mild. Associated symptoms include frequency and urgency. Pertinent negatives include no chills, discharge, flank pain, hematuria, hesitancy, nausea or sweats. Associated symptoms comments: Lower /suprapubic discomfort/nocturia. Treatments tried: azo. The treatment provided mild relief. There is no history of recurrent UTIs.    No problem-specific Assessment & Plan notes found for this encounter.   Past Medical History:  Diagnosis Date  . Thyroid cancer [facility])     Past Surgical History:  Procedure Laterality Date  . CESAREAN SECTION     x 2  . COLONOSCOPY     in [place]- colonoscopy    History reviewed. No pertinent family history.  Social History   Socioeconomic History  . Marital status: Married    Spouse name: Not on file  . Number of children: Not on file  . Years of education: Not on file  . Highest education level: Not on file  Social Needs  . Financial resource strain: Not on file  . Food insecurity - worry: Not on file  . Food insecurity - inability: Not on file  . Transportation needs - medical: Not on file  . Transportation needs - non-medical: Not on file  Occupational History  . Not on file  Tobacco Use  . Smoking status: Never Smoker  . Smokeless tobacco: Never Used  Substance and Sexual Activity  . Alcohol use: No    Alcohol/week: 0.0 oz  . Drug use: No  . Sexual activity: Not on file  Other Topics Concern  . Not on file  Social History Narrative  . Not on file    No Known Allergies  Outpatient Medications Prior to Visit  Medication Sig Dispense Refill  . Multiple Vitamin (MULTI-VITAMINS) TABS Take 1  tablet by mouth daily.    . metroNIDAZOLE (FLAGYL) 500 MG tablet Take 1 tablet (500 mg total) by mouth 2 (two) times daily. 20 tablet 0  . sulfamethoxazole-trimethoprim (BACTRIM DS,SEPTRA DS) 800-160 MG tablet Take 1 tablet by mouth 2 (two) times daily. 20 tablet 0   No facility-administered medications prior to visit.     Review of Systems  Constitutional: Negative for chills, fever, malaise/fatigue and weight loss.  HENT: Negative for ear discharge, ear pain and sore throat.   Eyes: Negative for blurred vision.  Respiratory: Negative for cough, sputum production, shortness of breath and wheezing.   Cardiovascular: Negative for chest pain, palpitations and leg swelling.  Gastrointestinal: Negative for abdominal pain, blood in stool, constipation, diarrhea, heartburn, melena and nausea.  Genitourinary: Positive for frequency and urgency. Negative for dysuria, flank pain, hematuria and hesitancy.  Musculoskeletal: Negative for back pain, joint pain, myalgias and neck pain.  Skin: Negative for rash.  Neurological: Negative for dizziness, tingling, sensory change, focal weakness and headaches.  Endo/Heme/Allergies: Negative for environmental allergies and polydipsia. Does not bruise/bleed easily.  Psychiatric/Behavioral: Negative for depression and suicidal ideas. The patient is not nervous/anxious and does not have insomnia.      Objective  Vitals:   07/17/17 1418  BP: 120/80  Pulse: 80  Temp: 100 F (37.8 C)  TempSrc: Oral  Weight: 140 lb (63.5  kg)  Height: 5\' 3"  (1.6 m)    Physical Exam  Constitutional: She is well-developed, well-nourished, and in no distress. No distress.  HENT:  Head: Normocephalic and atraumatic.  Right Ear: External ear normal.  Left Ear: External ear normal.  Nose: Nose normal.  Mouth/Throat: Oropharynx is clear and moist.  Eyes: Conjunctivae and EOM are normal. Pupils are equal, round, and reactive to light. Right eye exhibits no discharge. Left eye  exhibits no discharge.  Neck: Normal range of motion. Neck supple. No JVD present. No thyromegaly present.  Cardiovascular: Normal rate, regular rhythm, normal heart sounds and intact distal pulses. Exam reveals no gallop and no friction rub.  No murmur heard. Pulmonary/Chest: Effort normal and breath sounds normal. She has no wheezes. She has no rales.  Abdominal: Soft. Bowel sounds are normal. She exhibits no distension and no mass. There is no hepatosplenomegaly. There is tenderness in the suprapubic area. There is no rigidity, no rebound, no guarding, no CVA tenderness and negative Murphy's sign.  Musculoskeletal: Normal range of motion. She exhibits no edema.  Lymphadenopathy:    She has no cervical adenopathy.  Neurological: She is alert. She has normal reflexes.  Skin: Skin is warm and dry. She is not diaphoretic.  Psychiatric: Mood and affect normal.  Nursing note and vitals reviewed.     Assessment & Plan  Problem List Items Addressed This Visit    None    Visit Diagnoses    Pyelocystitis    -  Primary   Relevant Medications   fluconazole (DIFLUCAN) 150 MG tablet   Other Relevant Orders   CBC with Differential/Platelet   Urine Culture   POCT Urinalysis Dipstick (Completed)   Acute cystitis with hematuria       Relevant Orders   CBC with Differential/Platelet   Urine Culture   POCT Urinalysis Dipstick (Completed)   Vaginal discharge       Relevant Medications   fluconazole (DIFLUCAN) 150 MG tablet      Meds ordered this encounter  Medications  . fluconazole (DIFLUCAN) 150 MG tablet    Sig: Take 1 tablet (150 mg total) once for 1 dose by mouth.    Dispense:  1 tablet    Refill:  0  . ciprofloxacin (CIPRO) 500 MG tablet    Sig: Take 1 tablet (500 mg total) 2 (two) times daily by mouth.    Dispense:  14 tablet    Refill:  0      Dr. Otilio Miu [facility] [place] [facility]  07/17/17

## 2017-07-19 LAB — URINE CULTURE

## 2017-09-07 ENCOUNTER — Other Ambulatory Visit: Payer: Self-pay | Admitting: Internal Medicine

## 2017-09-07 DIAGNOSIS — E042 Nontoxic multinodular goiter: Secondary | ICD-10-CM

## 2017-09-12 ENCOUNTER — Ambulatory Visit
Admission: RE | Admit: 2017-09-12 | Discharge: 2017-09-12 | Disposition: A | Discharge status: Home / Self Care | Payer: No Typology Code available for payment source | Source: Ambulatory Visit | Attending: Internal Medicine | Admitting: Internal Medicine

## 2017-09-12 DIAGNOSIS — E042 Nontoxic multinodular goiter: Secondary | ICD-10-CM

## 2018-03-20 ENCOUNTER — Other Ambulatory Visit: Payer: Self-pay | Admitting: Internal Medicine

## 2018-03-20 DIAGNOSIS — E042 Nontoxic multinodular goiter: Secondary | ICD-10-CM

## 2018-09-10 ENCOUNTER — Ambulatory Visit
Admission: RE | Admit: 2018-09-10 | Discharge: 2018-09-10 | Disposition: A | Discharge status: Home / Self Care | Payer: No Typology Code available for payment source | Source: Ambulatory Visit | Attending: Internal Medicine | Admitting: Internal Medicine

## 2018-09-10 DIAGNOSIS — E042 Nontoxic multinodular goiter: Secondary | ICD-10-CM

## 2019-03-01 ENCOUNTER — Other Ambulatory Visit: Payer: Self-pay | Admitting: Internal Medicine

## 2019-03-01 DIAGNOSIS — E042 Nontoxic multinodular goiter: Secondary | ICD-10-CM

## 2019-03-01 DIAGNOSIS — R896 Abnormal cytological findings in specimens from other organs, systems and tissues: Secondary | ICD-10-CM

## 2019-03-19 IMAGING — US US THYROID
1 series · 13 of 25 positions shown · non-contrast
Comparison: 11/24/2015

CLINICAL DATA: Thyroid nodules

EXAM:
THYROID ULTRASOUND
TECHNIQUE: Ultrasound examination of the thyroid gland and adjacent soft
tissues was performed.

[Series 1: us thyroid · 0.04mm/px · 13 of 41 slices shown]
[im 1/41]
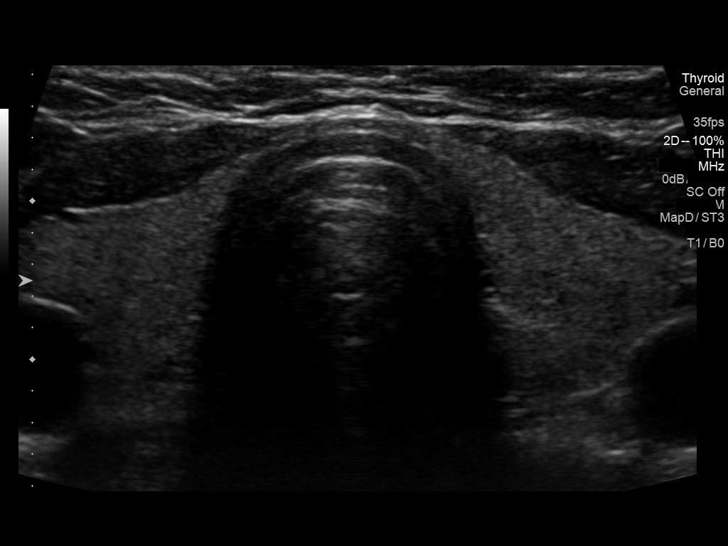
[im 4/41]
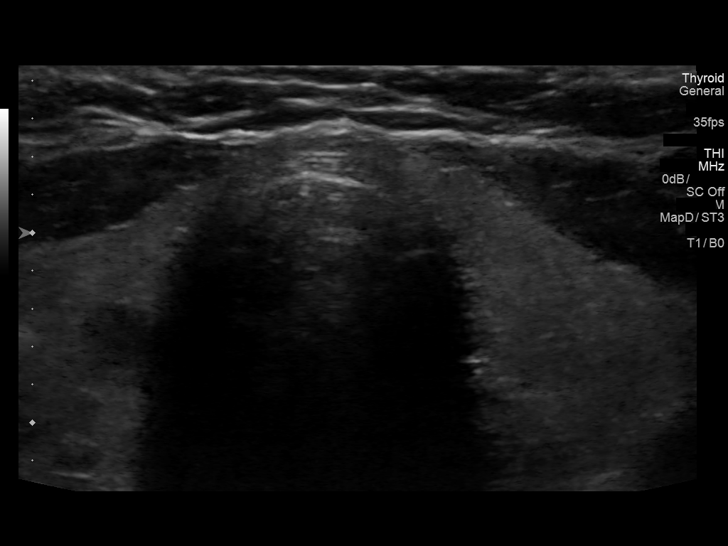
[im 7/41]
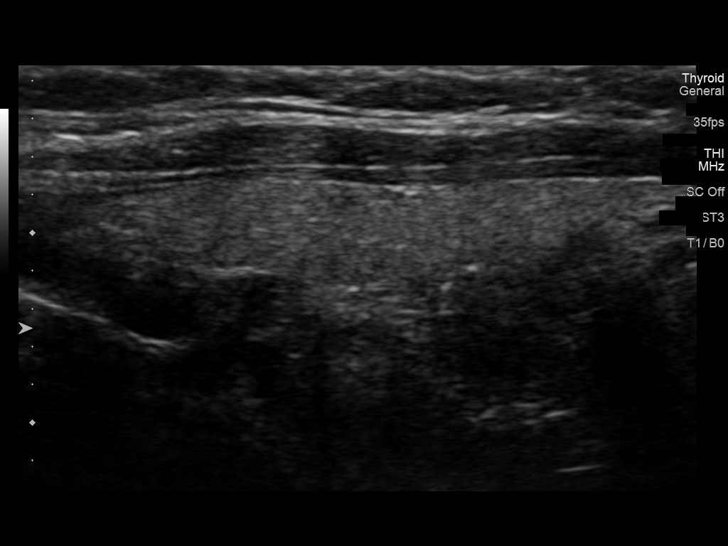
[im 11/41]
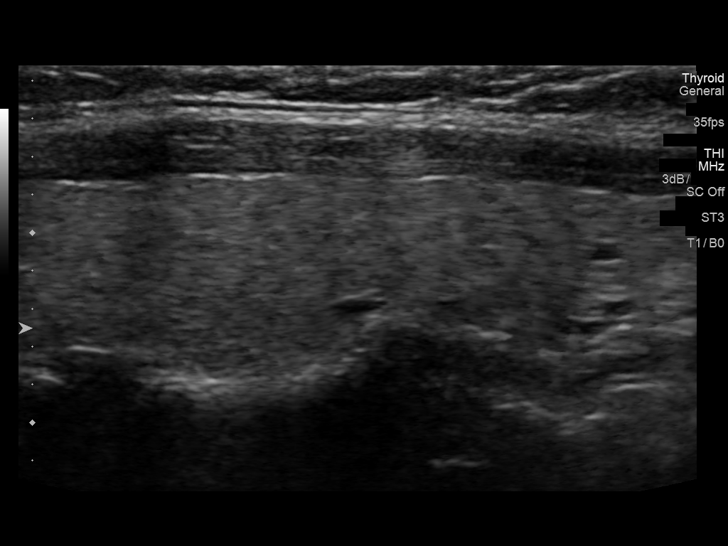
[im 14/41]
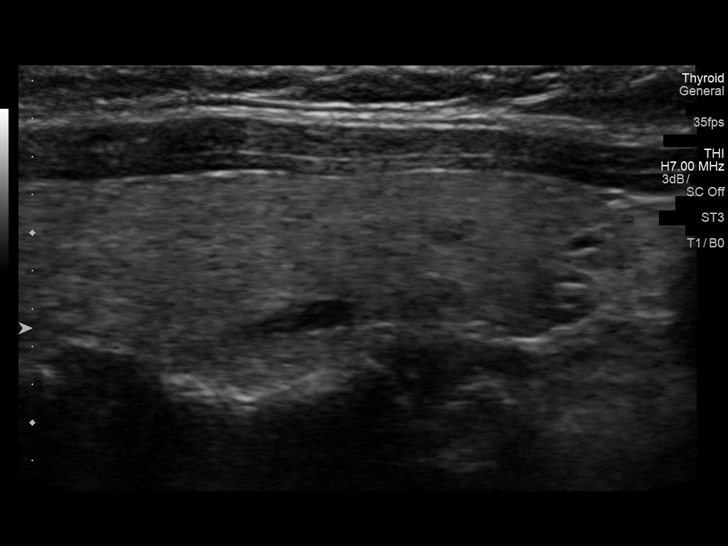
[im 17/41]
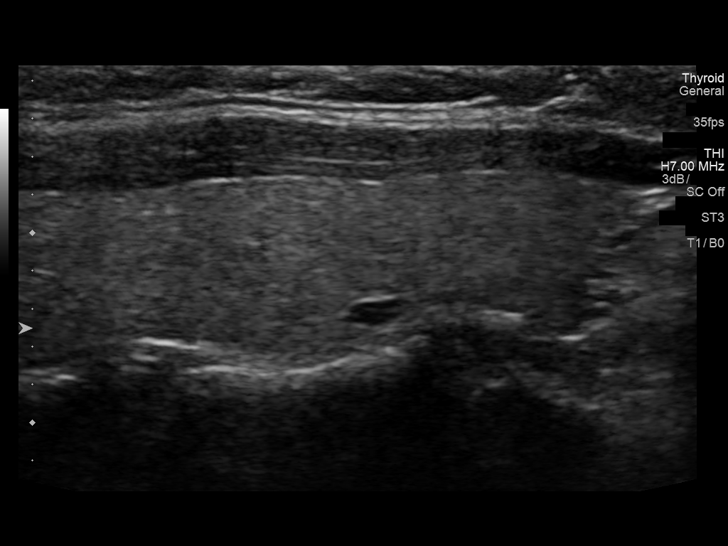
[im 21/41]
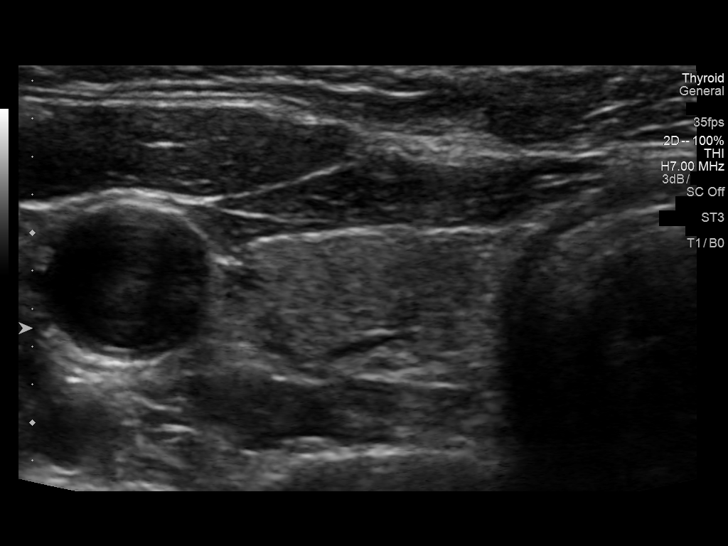
[im 24/41]
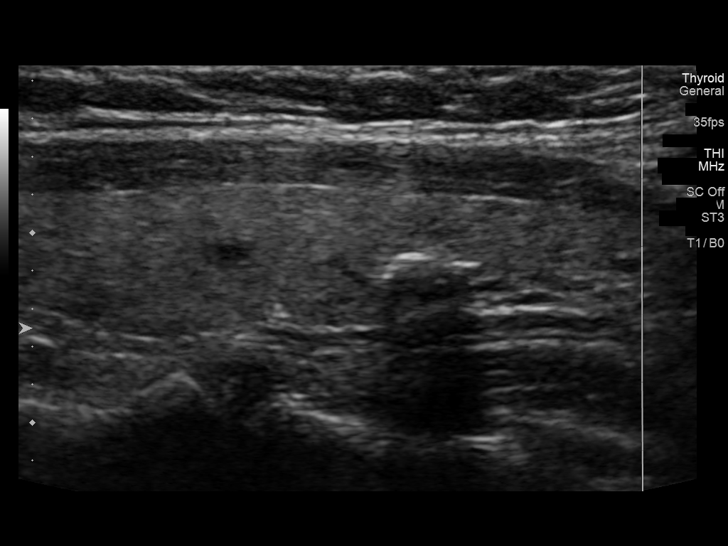
[im 27/41]
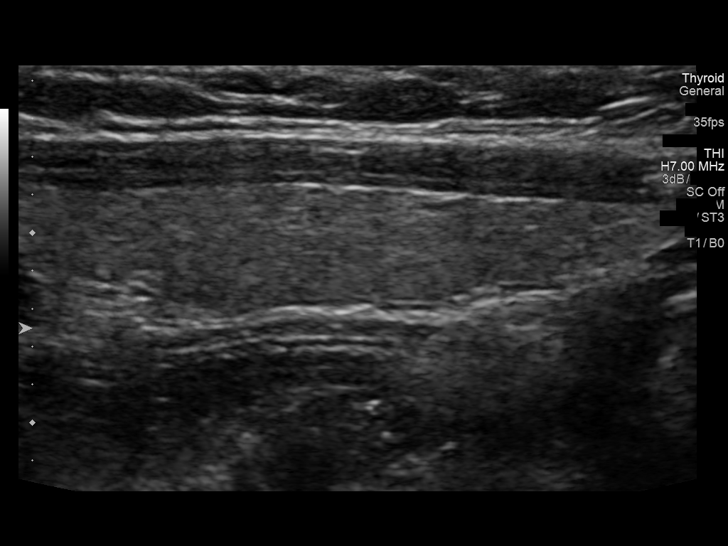
[im 31/41]
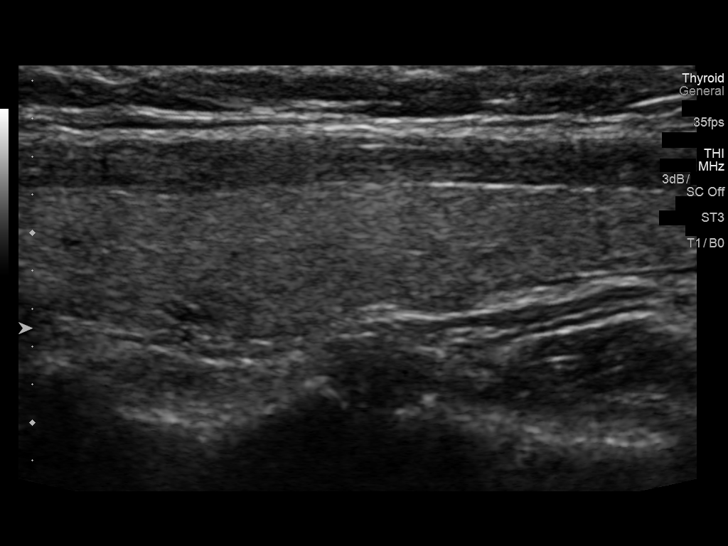
[im 34/41]
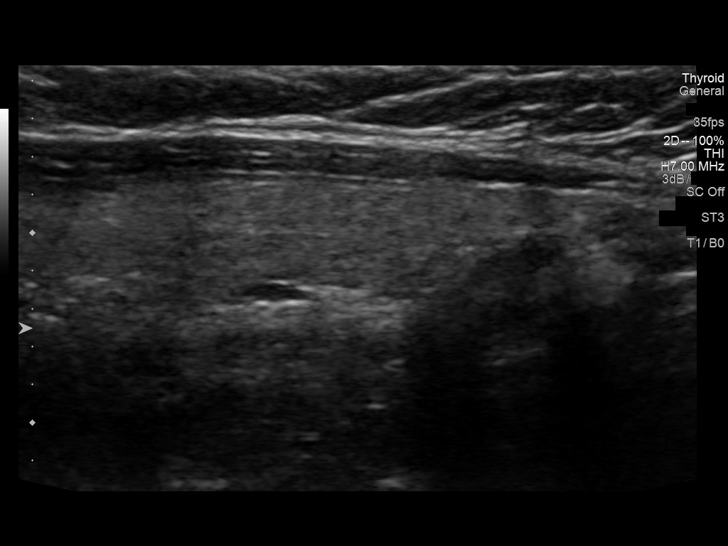
[im 37/41]
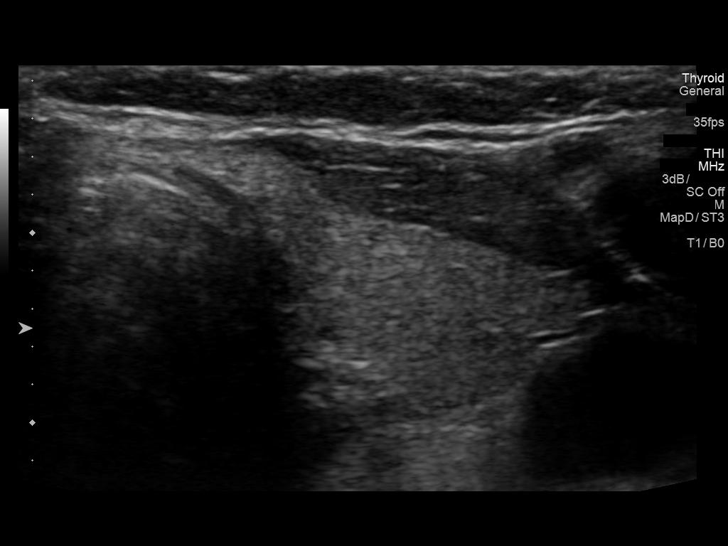
[im 41/41]
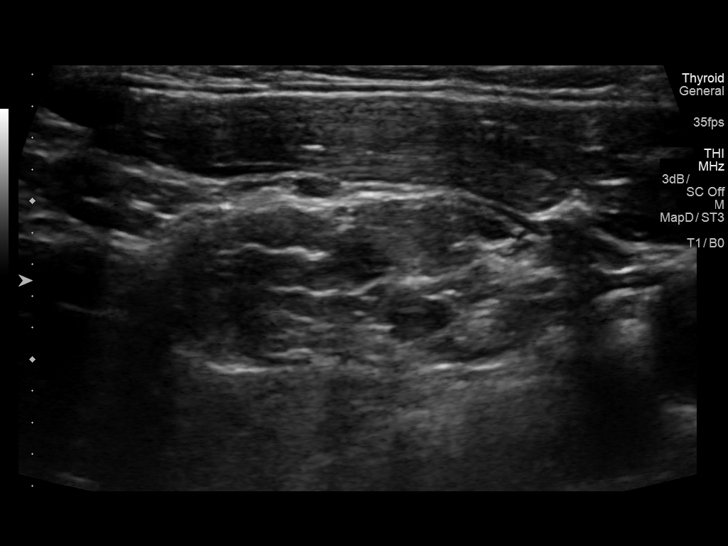

[13 of 25 positions shown; findings below may reference images not displayed]

FINDINGS: Parenchymal Echotexture: Mildly heterogenous

Isthmus: 1 mm, unchanged

Right lobe: 5.5 x 1.1 x 1.4 cm, previously 7.1 x 0.9 x 1.5 cm

Left lobe: 4.9 x 0.9 x 1.6 cm, previously 6.1 x 0.9 x 1.6 cm

_________________________________________________________

Estimated total number of nodules >/= 1 cm: 0

Number of spongiform nodules >/=  2 cm not described below (TR1): 0

Number of mixed cystic and solid nodules >/= 1.5 cm not described
below (TR2): 0

_________________________________________________________

Two small hypoechoic solid nodules in the right lobe are stable and
measure 5 mm or less in size. These do not meet criteria for biopsy
or additional follow-up. Stable echogenic shadowing calcification in
the left thyroid lower pole. No other significant left thyroid
abnormality. No adenopathy.
IMPRESSION: Stable subcentimeter thyroid nodules all measuring 5 mm or less in
size. These do not meet criteria for biopsy or any additional
follow-up.

The above is in keeping with the ACR TI-RADS recommendations - [HOSPITAL] 8790;[DATE].

## 2019-09-03 IMAGING — US US THYROID
1 series · 13 of 25 positions shown · non-contrast
Comparison: 03/28/2017; 11/24/2015; 10/26/2010

CLINICAL DATA: Goiter.

EXAM:
THYROID ULTRASOUND
TECHNIQUE: Ultrasound examination of the thyroid gland and adjacent soft
tissues was performed.

[Series 1: us thyroid · 0.06mm/px · 13 of 45 slices shown]
[im 1/45]
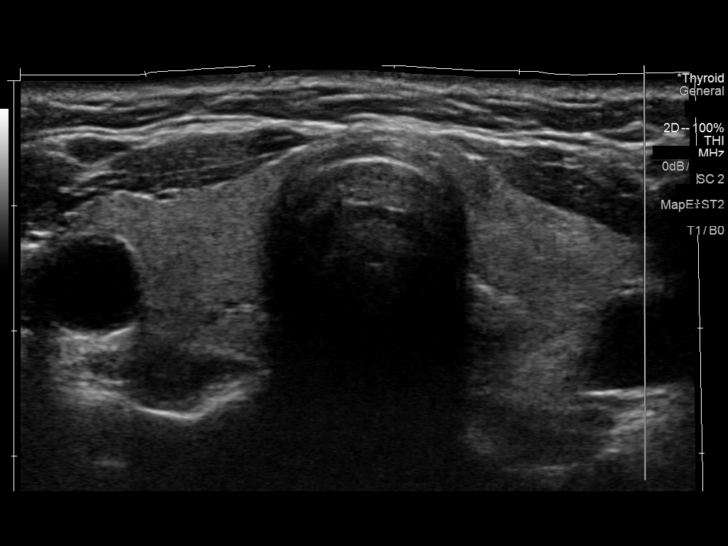
[im 4/45]
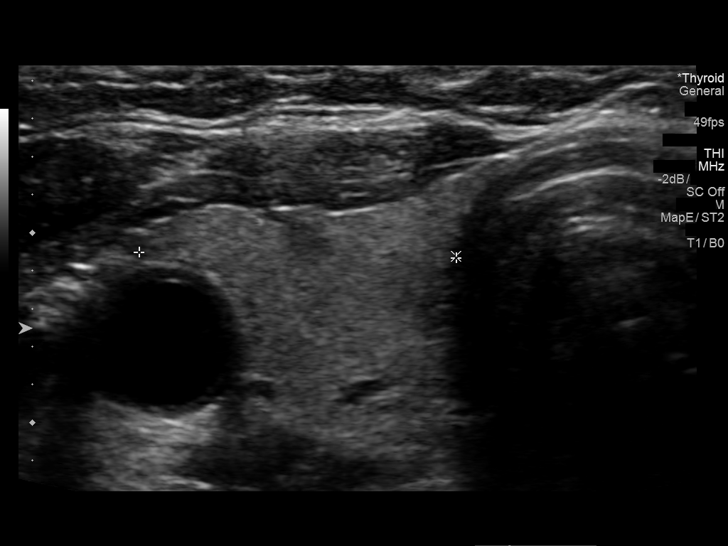
[im 8/45]
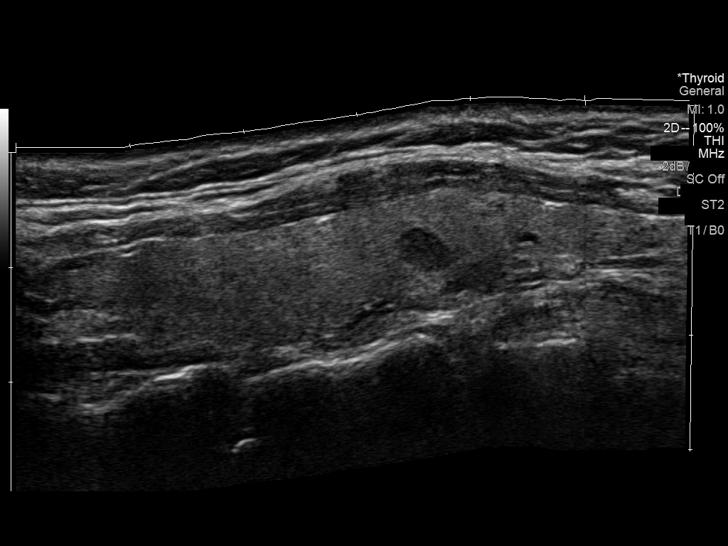
[im 12/45]
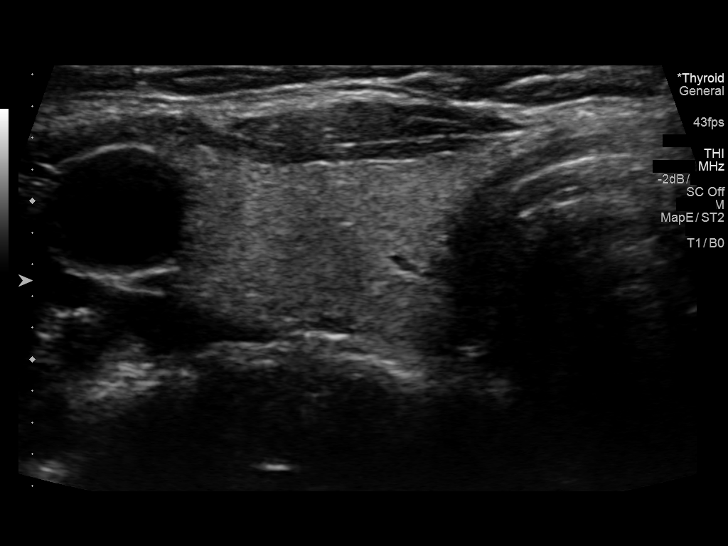
[im 15/45]
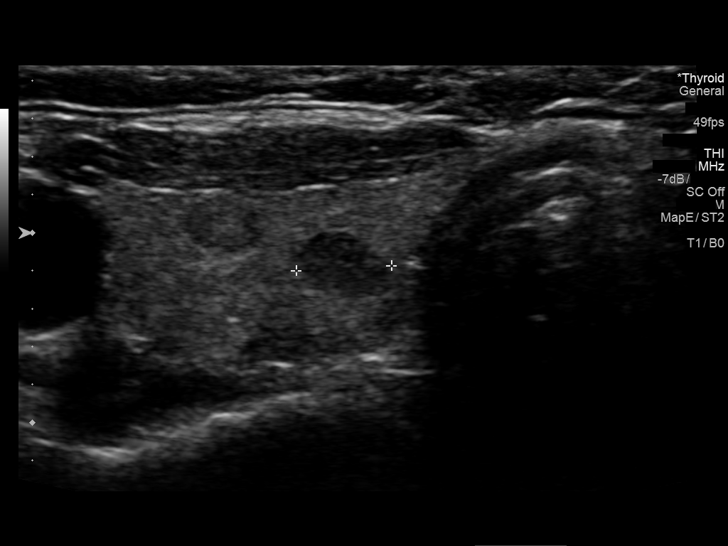
[im 19/45]
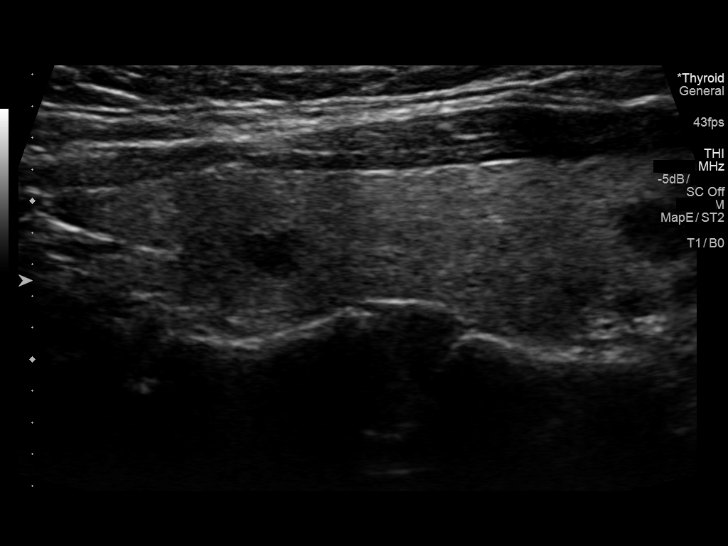
[im 23/45]
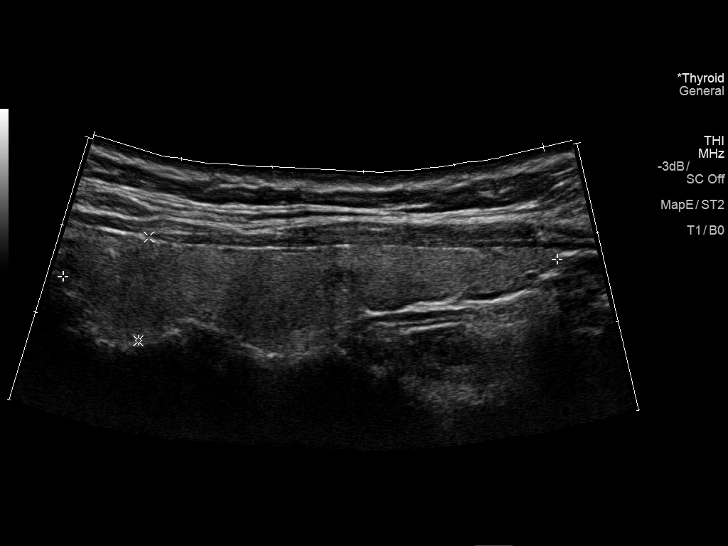
[im 26/45]
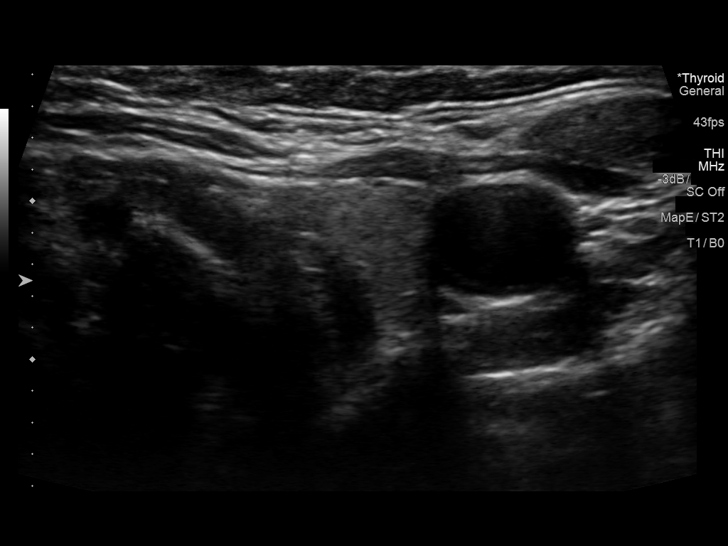
[im 30/45]
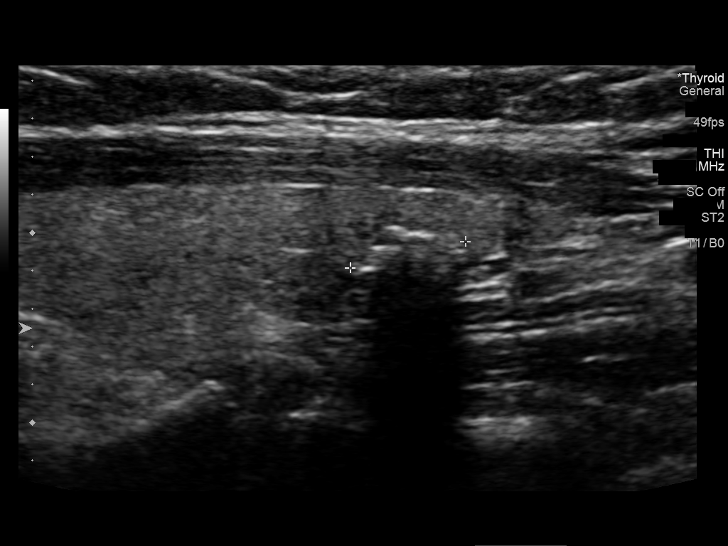
[im 34/45]
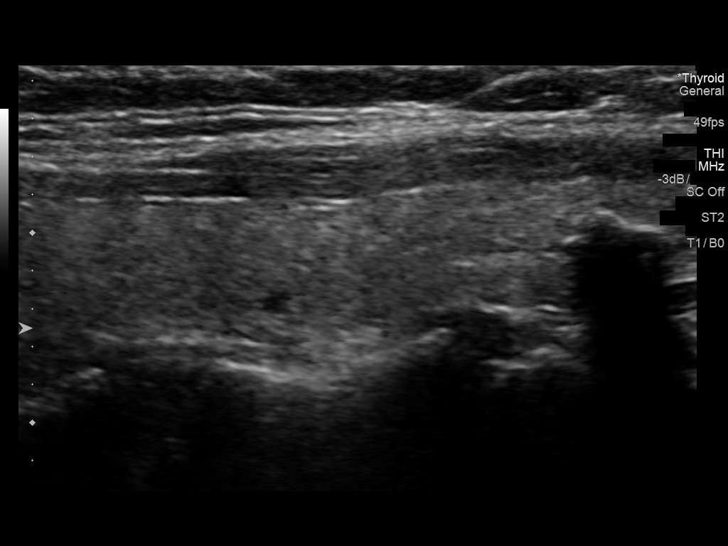
[im 37/45]
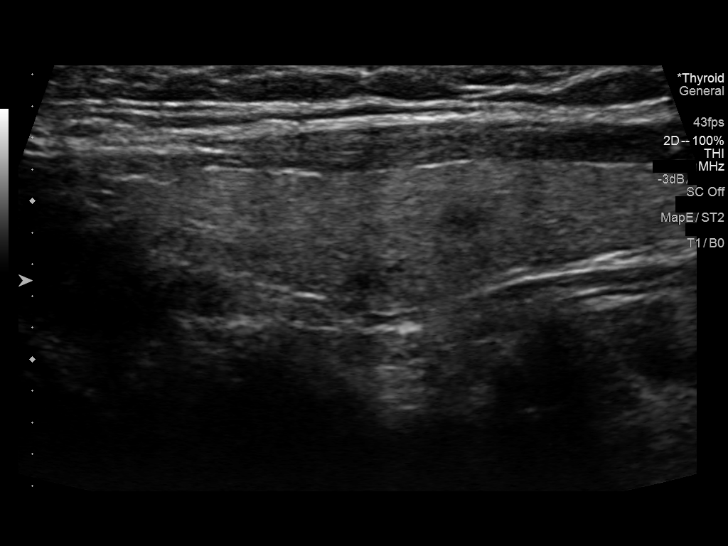
[im 41/45]
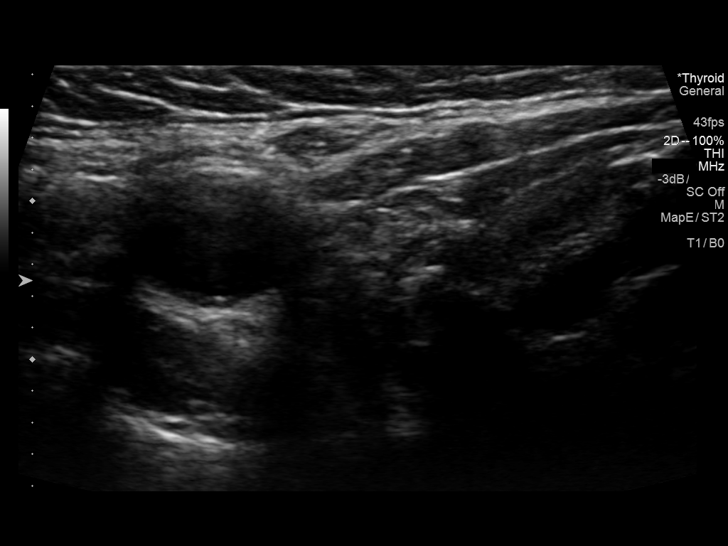
[im 45/45]
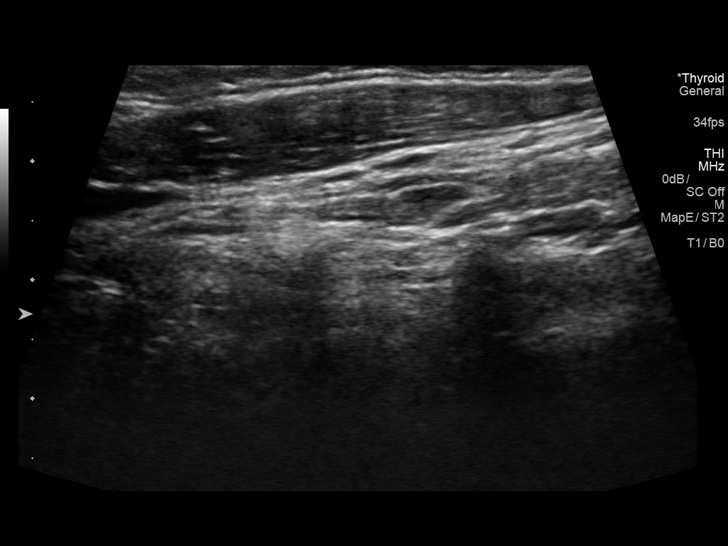

[13 of 25 positions shown; findings below may reference images not displayed]

FINDINGS: Parenchymal Echotexture: Mildly heterogenous

Isthmus: Normal in size measures 0.1 cm in diameter, unchanged

Right lobe: Normal in size measuring 5.6 x 1.1 x 1.7 cm, unchanged,
previously, 5.5 x 1.1 x 1.4 cm

Left lobe: Normal in size measuring 5.4 x 1.1 x 1.5 cm, unchanged
previously, 4.9 x 0.9 x 1.6 cm

_________________________________________________________

Estimated total number of nodules >/= 1 cm: 0

Number of spongiform nodules >/=  2 cm not described below (TR1): 0

Number of mixed cystic and solid nodules >/= 1.5 cm not described
below (TR2): 0

_________________________________________________________

There is a punctate (approximately 0.4 cm) nodule within the
superior pole the right lobe of the thyroid which appears similar to
the [DATE] examination and again does not meet imaging criteria to
recommend percutaneous sampling or dedicated follow-up.

There is a punctate (approximately 0.4 cm) nodule within inferior
anterior aspect the right lobe of the thyroid which is unchanged
compared to the [DATE] examination and does not meet imaging
criteria to recommend percutaneous sampling

There is a punctate (approximately 0.5 cm) hypoechoic nodule within
the inferior pole the right lobe of the thyroid which is unchanged
compared to the [DATE] examination. Stability for greater than 5
years is indicative of benign etiology.

There is a punctate (approximately 0.4 cm) hypoechoic nodule/pseudo
nodule within the mid aspect the left lobe of the thyroid which is
unchanged compared to the [DATE] examination and does not meet
imaging criteria to recommend percutaneous sampling or continued
follow-up.

There is an approximately 0.6 x 0.6 x 0.3 cm peripherally calcified
and densely shadowing nodule within the inferior pole of the left
lobe of the thyroid is unchanged compared to the [DATE]
examination, previously, 0.7 x 0.6 x 0.5 cm. Stability for greater
than 5 years is indicative of benign etiology.
IMPRESSION: 1. Findings suggestive of multinodular goiter. No new or enlarging
thyroid nodules.
2. None of the discretely measured thyroid nodules meet imaging
criteria to recommend percutaneous sampling or continued dedicated
follow-up. Specifically, the dominant approximately 0.6 cm
peripherally calcified and densely shadowing nodule within inferior
pole of the left lobe of the thyroid is unchanged compared to the
[DATE] examination. Stability for greater than 5 years is
indicative of a benign etiology.

The above is in keeping with the ACR TI-RADS recommendations - [HOSPITAL] [id number];[DATE].

## 2019-09-17 ENCOUNTER — Other Ambulatory Visit: Payer: Self-pay

## 2019-12-17 ENCOUNTER — Other Ambulatory Visit: Payer: Self-pay

## 2020-03-02 ENCOUNTER — Other Ambulatory Visit: Payer: Self-pay | Admitting: Internal Medicine

## 2020-03-02 DIAGNOSIS — E042 Nontoxic multinodular goiter: Secondary | ICD-10-CM

## 2020-03-12 ENCOUNTER — Ambulatory Visit
Admission: RE | Admit: 2020-03-12 | Discharge: 2020-03-12 | Disposition: A | Discharge status: Home / Self Care | Payer: No Typology Code available for payment source | Source: Ambulatory Visit | Attending: Internal Medicine | Admitting: Internal Medicine

## 2020-03-12 DIAGNOSIS — E042 Nontoxic multinodular goiter: Secondary | ICD-10-CM

## 2020-04-21 ENCOUNTER — Other Ambulatory Visit: Payer: Self-pay | Admitting: Internal Medicine

## 2021-06-30 IMAGING — US US THYROID
1 series · 13 of 25 positions shown · non-contrast
Comparison: Multiple prior.

CLINICAL DATA: 59-year-old female with multinodular thyroid

EXAM:
THYROID ULTRASOUND
TECHNIQUE: Ultrasound examination of the thyroid gland and adjacent soft
tissues was performed.

[Series 1: us thyroid · 0.04mm/px · 13 of 44 slices shown]
[im 1/44]
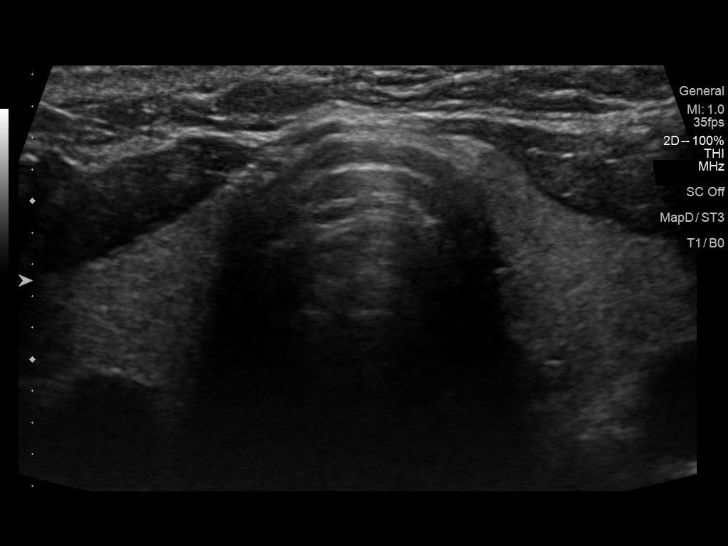
[im 4/44]
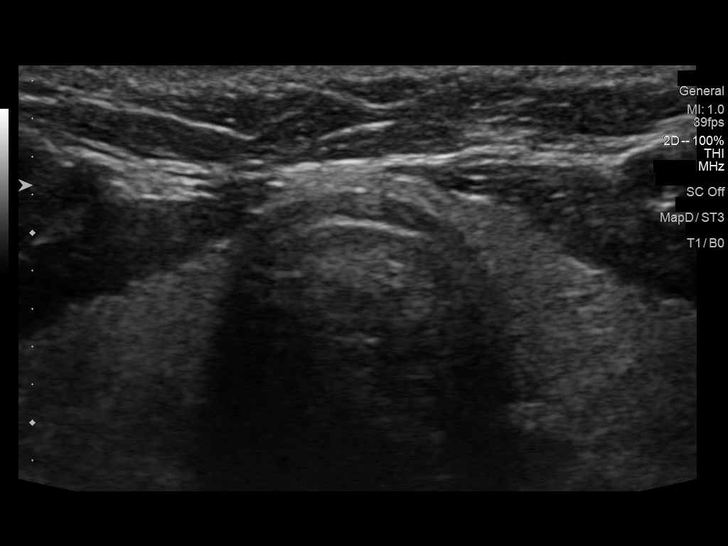
[im 8/44]
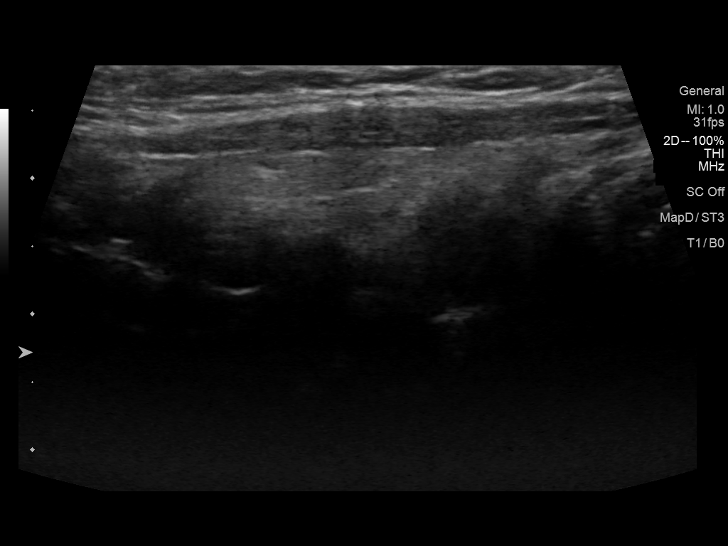
[im 11/44]
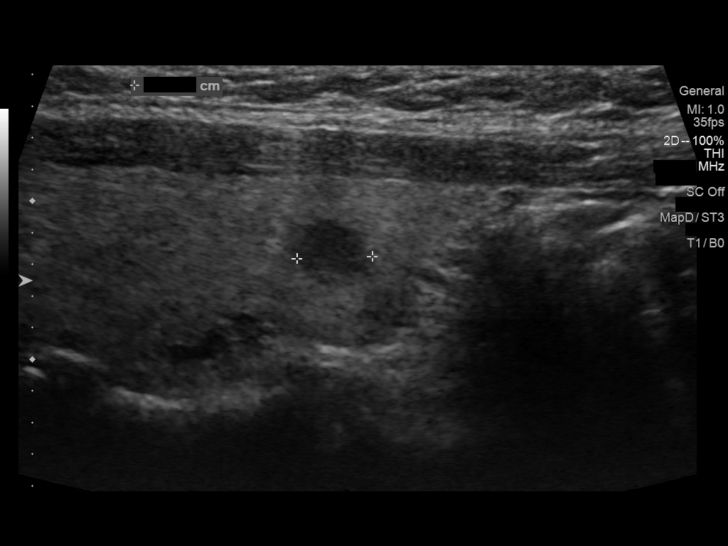
[im 15/44]
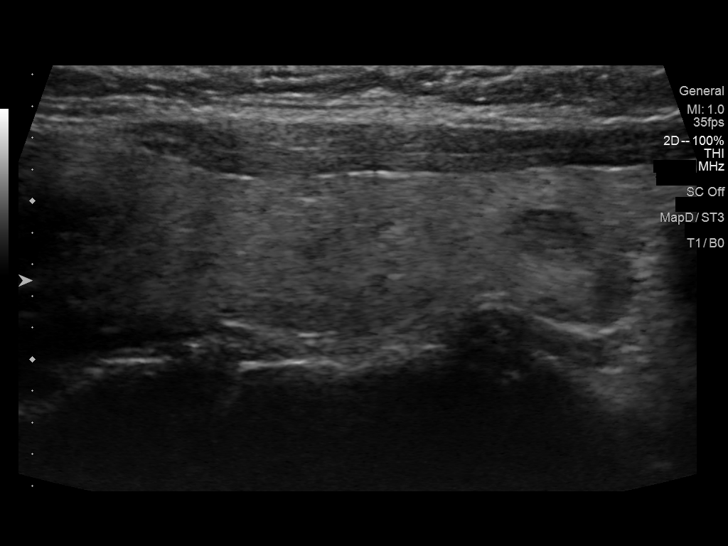
[im 18/44]
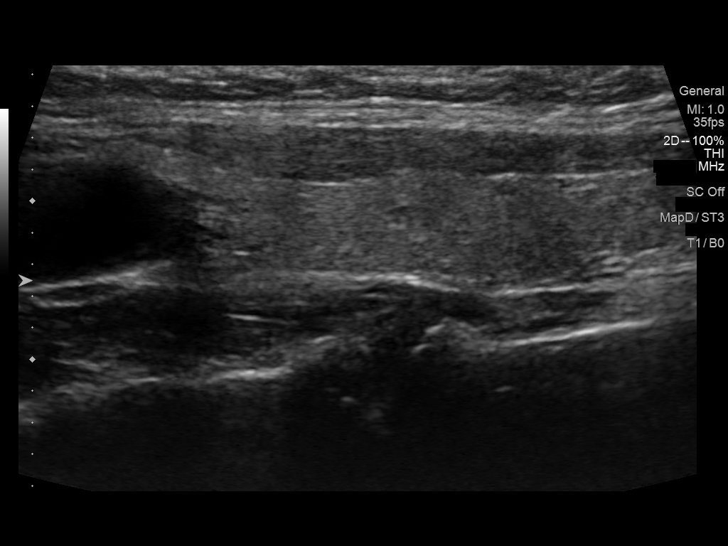
[im 22/44]
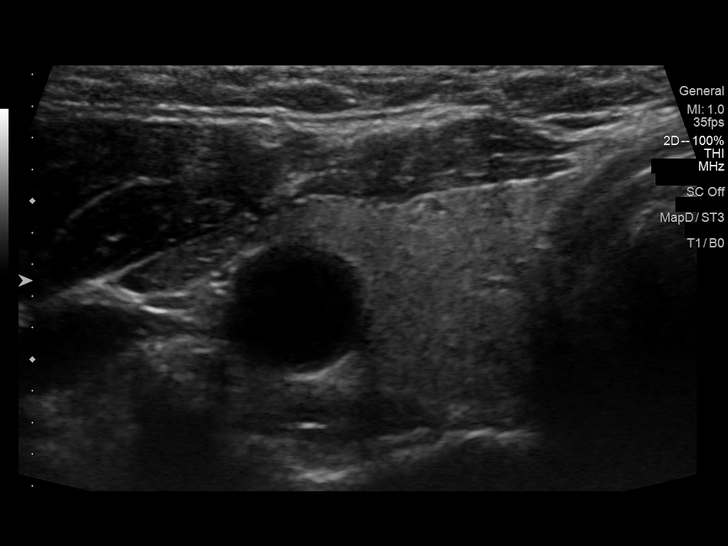
[im 26/44]
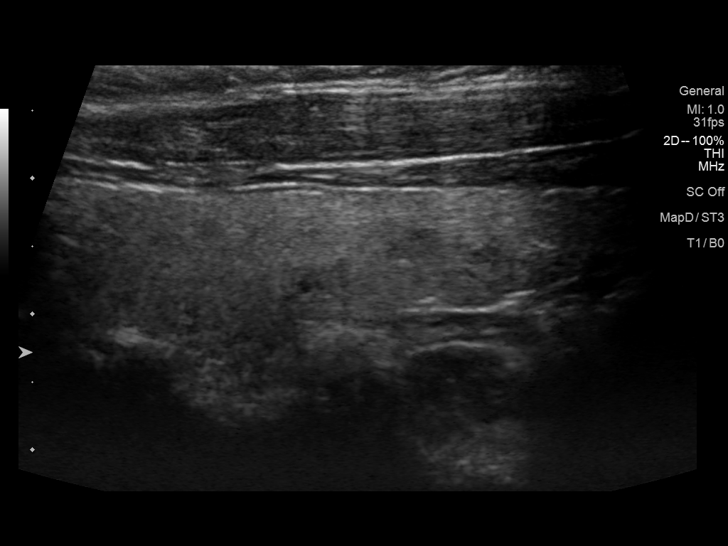
[im 29/44]
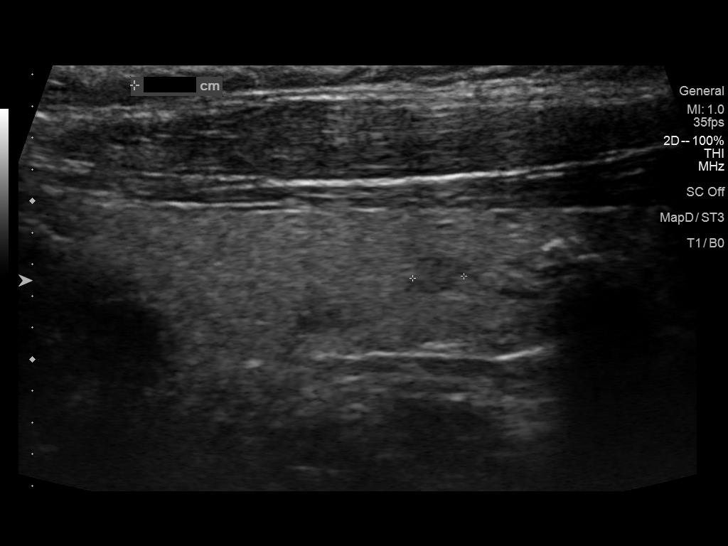
[im 33/44]
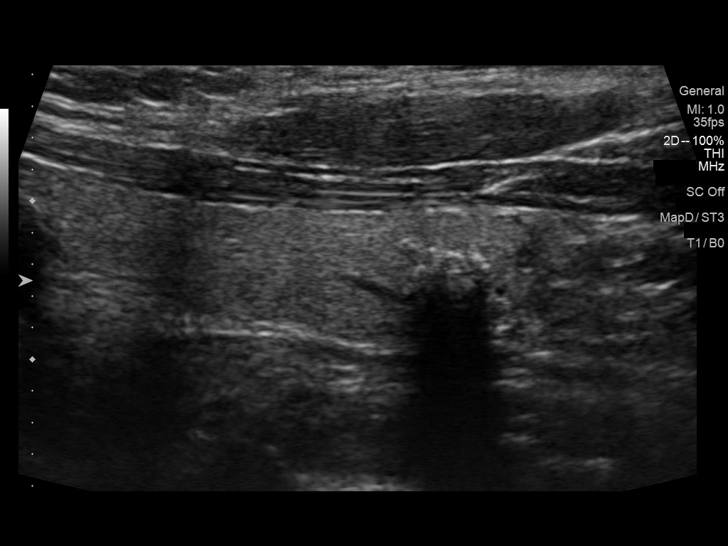
[im 36/44]
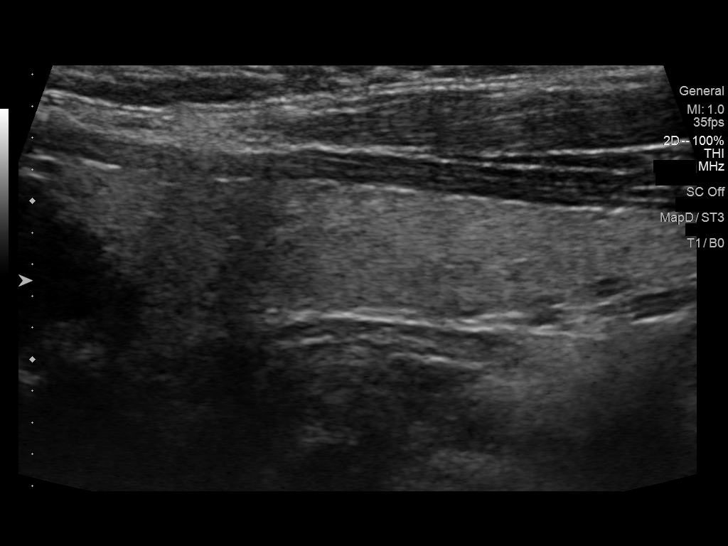
[im 40/44]
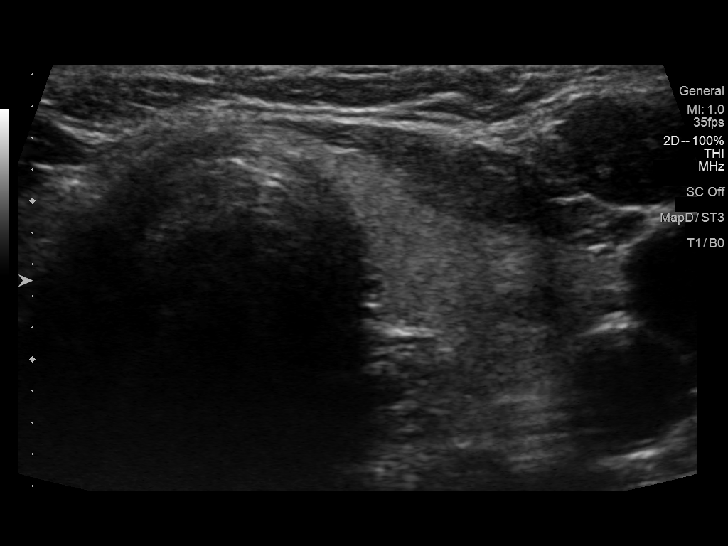
[im 44/44]
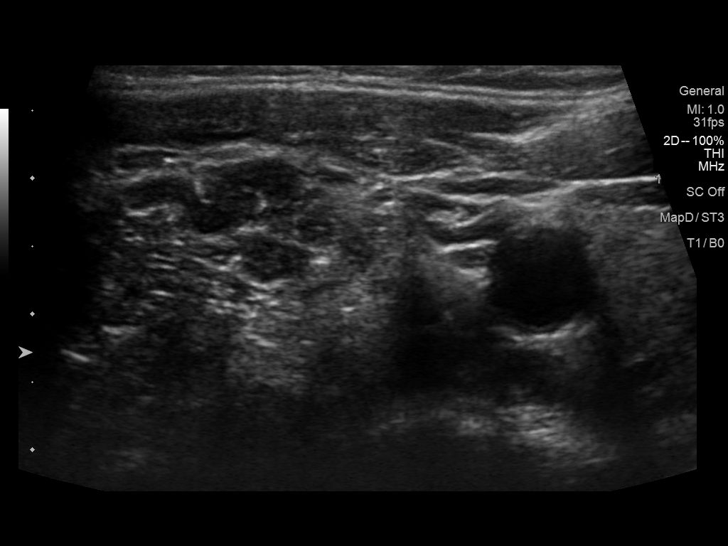

[13 of 25 positions shown; findings below may reference images not displayed]

FINDINGS: Parenchymal Echotexture: Mildly heterogenous

Isthmus: 0.2 cm

Right lobe: 4.8 cm x 1.3 cm x 1.7 cm

Left lobe: 4.1 cm x 1.3 cm x 1.6 cm

_________________________________________________________

Estimated total number of nodules >/= 1 cm: 0

Number of spongiform nodules >/=  2 cm not described below (TR1): 0

Number of mixed cystic and solid nodules >/= 1.5 cm not described
below (TR2): 0

_________________________________________________________

Nodule labeled 1 on the left, compatible with coarse calcification,
unchanged at 6 mm, and does not meet criteria for surveillance or
biopsy.

Incidentally imaged small nodule of the right thyroid, 5 mm. This
does not meet criteria for surveillance or biopsy.

No adenopathy
IMPRESSION: No thyroid nodule meets criteria for biopsy or surveillance, as
designated by the newly established ACR TI-RADS criteria.

Recommendations follow those established by the new ACR TI-RADS
criteria ([HOSPITAL] [id number];[DATE]).
